# Patient Record
Sex: Female | Born: 1990
Health system: Southern US, Community
[De-identification: ages and names within clinical notes are randomized; demographics above are authoritative.]

## PROBLEM LIST (undated history)

## (undated) ENCOUNTER — Inpatient Hospital Stay (HOSPITAL_COMMUNITY): Payer: Self-pay

## (undated) DIAGNOSIS — Z789 Other specified health status: Secondary | ICD-10-CM

## (undated) DIAGNOSIS — O139 Gestational [pregnancy-induced] hypertension without significant proteinuria, unspecified trimester: Secondary | ICD-10-CM

## (undated) DIAGNOSIS — N390 Urinary tract infection, site not specified: Secondary | ICD-10-CM

## (undated) DIAGNOSIS — R519 Headache, unspecified: Secondary | ICD-10-CM

## (undated) DIAGNOSIS — K8020 Calculus of gallbladder without cholecystitis without obstruction: Secondary | ICD-10-CM

## (undated) HISTORY — DX: Other specified health status: Z78.9

## (undated) HISTORY — PX: APPENDECTOMY: SHX54

---

## 2018-12-12 NOTE — L&D Delivery Note (Signed)
Delivery Note  Labor onset: 09/17/2019  Labor Onset Time:[id number] Complete dilation at 3:00 PM  Onset of pushing at 1502 FHR second stage Cat 1 Analgesia/Anesthesia intrapartum: Epidural  Pt has female circumcision involving excision and fusion of the labia minora. Head was not delivered w/ strong maternal pushing efforts due to narrowed introitus. Defibulation of labia minora required for delivery. Pt informed and gives verbal consent. Site injected w/1% Lidocaine and cut. Delivery of a viable female w/ the force of 2 ctx @ 1708 by Clois Dupes, CNM and attended by Dr. Charlesetta Garibaldi. Fetal head delivered in LOA position and restituted to LOT.  No nuchal cord. Infant placed on maternal abd, dried, and tactile stim.   Cord double clamped after cessation of pulsation and cut by father.  Cord blood sample collected   Placenta delivered Schultz, intact, with 3 VC.  Placenta to L&D. Uterine tone firm w/ minimal bleeding   2nd degree lac identified. Defibrilated labia minor hemostatic Anesthesia: Epidural, 1% Lidocaine Repair 3-0 Vicryl, 4-0 Vicryl QBL (mL): 505 Complications: none APGAR: APGAR (1 MIN): 9   APGAR (5 MINS): 9   Weight: 2835 g/ 6# 4oz  Mom to Postpartum care on [facility].  Baby to Couplet care / Skin to Skin.  Arrie Eastern MSN, CNM 09/17/2019, 6:27 PM

## 2019-02-26 LAB — OB RESULTS CONSOLE ABO/RH: RH Type: POSITIVE

## 2019-02-26 LAB — OB RESULTS CONSOLE GC/CHLAMYDIA
Chlamydia: NEGATIVE
Gonorrhea: NEGATIVE

## 2019-02-26 LAB — OB RESULTS CONSOLE RUBELLA ANTIBODY, IGM: Rubella: IMMUNE

## 2019-02-26 LAB — OB RESULTS CONSOLE HEPATITIS B SURFACE ANTIGEN: Hepatitis B Surface Ag: NEGATIVE

## 2019-02-26 LAB — OB RESULTS CONSOLE ANTIBODY SCREEN: Antibody Screen: NEGATIVE

## 2019-02-26 LAB — OB RESULTS CONSOLE HIV ANTIBODY (ROUTINE TESTING): HIV: NONREACTIVE

## 2019-02-26 LAB — OB RESULTS CONSOLE RPR: RPR: NONREACTIVE

## 2019-07-19 ENCOUNTER — Other Ambulatory Visit: Payer: Self-pay

## 2019-07-19 ENCOUNTER — Encounter: Payer: Self-pay | Admitting: Student

## 2019-07-19 ENCOUNTER — Inpatient Hospital Stay (HOSPITAL_COMMUNITY)
Admission: AD | Admit: 2019-07-19 | Discharge: 2019-07-19 | Disposition: A | Discharge status: Home / Self Care | Payer: 59 | Attending: Obstetrics and Gynecology | Admitting: Obstetrics and Gynecology

## 2019-07-19 DIAGNOSIS — Z3A30 30 weeks gestation of pregnancy: Secondary | ICD-10-CM | POA: Insufficient documentation

## 2019-07-19 DIAGNOSIS — O479 False labor, unspecified: Secondary | ICD-10-CM | POA: Diagnosis not present

## 2019-07-19 DIAGNOSIS — O4703 False labor before 37 completed weeks of gestation, third trimester: Secondary | ICD-10-CM | POA: Diagnosis not present

## 2019-07-19 DIAGNOSIS — R1030 Lower abdominal pain, unspecified: Secondary | ICD-10-CM | POA: Diagnosis present

## 2019-07-19 HISTORY — DX: Other specified health status: Z78.9

## 2019-07-19 LAB — WET PREP, GENITAL
Sperm: NONE SEEN
Trich, Wet Prep: NONE SEEN
Yeast Wet Prep HPF POC: NONE SEEN

## 2019-07-19 LAB — URINALYSIS, ROUTINE W REFLEX MICROSCOPIC
Bilirubin Urine: NEGATIVE
Glucose, UA: NEGATIVE mg/dL
Hgb urine dipstick: NEGATIVE
Ketones, ur: NEGATIVE mg/dL
Leukocytes,Ua: NEGATIVE
Nitrite: NEGATIVE
Protein, ur: NEGATIVE mg/dL
Specific Gravity, Urine: 1.003 — ABNORMAL LOW (ref 1.005–1.030)
pH: 7 (ref 5.0–8.0)

## 2019-07-19 NOTE — MAU Note (Addendum)
Sharp pain in lower abd that radiates to lower back since this am. Denies LOF or bleeding. Reports normal BM and no dysuria. Good FM. Has felt the pain total of 4 times today

## 2019-07-19 NOTE — Progress Notes (Signed)
Specimens obtained without speculum by CNM

## 2019-07-19 NOTE — MAU Provider Note (Signed)
Chief Complaint:  Abdominal Pain and Back Pain   First Provider Initiated Contact with Patient 07/19/19 2022      HPI: Angela Russo is a 28 y.o. G2P0010 at [redacted]w[redacted]d by LMP who presents to maternity admissions reporting sharp intermittent lower abdominal pain today. She reports occasional pain similar to this x 2 weeks but only once per day or less often. Today, she has felt this pain 4 times since this morning, approximately 3 hours apart.  She describes the pain as sharp pain in the middle of her low abdomen that radiates to her low back.  She is drinking water and resting but has not tried other treatments.  There are no other associated symptoms.  She is feeling normal fetal movement.  She speaks Arabic and interpreter was used for initial interview, but pt later declined interpreter and was able to answer questions and discuss medical history in [place] without interpreter. Pt aware that Arabic language interpreter is available at any time.   HPI  Past Medical History: Past Medical History:  Diagnosis Date  . Medical history non-contributory     Past obstetric history: OB History  Gravida Para Term Preterm AB Living  2       1 0  SAB TAB Ectopic Multiple Live Births               # Outcome Date GA Lbr Len/2nd Weight Sex Delivery Anes PTL Lv  2 Current           1 AB             Past Surgical History: Past Surgical History:  Procedure Laterality Date  . APPENDECTOMY      Family History: History reviewed. No pertinent family history.  Social History: Social History   Tobacco Use  . Smoking status: Not on file  Substance Use Topics  . Alcohol use: Never    Frequency: Never  . Drug use: Never    Allergies: No Known Allergies  Meds:  Medications Prior to Admission  Medication Sig Dispense Refill Last Dose  . acetaminophen (TYLENOL) 500 MG tablet Take 1,000 mg by mouth every 6 (six) hours as needed.   07/19/2019 at 1100  . Prenatal Vit-Fe Fumarate-FA (PRENATAL  MULTIVITAMIN) TABS tablet Take 1 tablet by mouth daily at 12 noon.   07/19/2019 at Unknown time    ROS:  Review of Systems  Constitutional: Negative for chills, fatigue and fever.  Eyes: Negative for visual disturbance.  Respiratory: Negative for shortness of breath.   Cardiovascular: Negative for chest pain.  Gastrointestinal: Positive for abdominal pain. Negative for nausea and vomiting.  Genitourinary: Negative for difficulty urinating, dysuria, flank pain, pelvic pain, vaginal bleeding, vaginal discharge and vaginal pain.  Musculoskeletal: Positive for back pain.  Neurological: Negative for dizziness and headaches.  Psychiatric/Behavioral: Negative.      I have reviewed patient's Past Medical Hx, Surgical Hx, Family Hx, Social Hx, medications and allergies.   Physical Exam   Patient Vitals for the past 24 hrs:  BP Temp Pulse Resp Height Weight  07/19/19 2050 111/66 - 89 - - -  07/19/19 1941 116/72 98.4 F (36.9 C) (!) 112 16 5\' 2"  (1.575 m) 47.2 kg   Constitutional: Well-developed, well-nourished female in no acute distress.  Cardiovascular: normal rate Respiratory: normal effort GI: Abd soft, non-tender, gravid appropriate for gestational age.  MS: Extremities nontender, no edema, normal ROM Neurologic: Alert and oriented x 4.  GU: Neg CVAT.   Dilation: Closed Effacement (%):  Thick Exam by:: Sharen CounterLisa Leftwich-Kirby CNM  FHT:  Baseline 150, moderate variability, accelerations present, no decelerations Contractions: None on toco, irritability noted   Labs: Results for orders placed or performed during the hospital encounter of 07/19/19 (from the past 24 hour(s))  Urinalysis, Routine w reflex microscopic     Status: Abnormal   Collection Time: 07/19/19  8:01 PM  Result Value Ref Range   Color, Urine YELLOW YELLOW   APPearance CLOUDY (A) CLEAR   Specific Gravity, Urine 1.003 (L) 1.005 - 1.030   pH 7.0 5.0 - 8.0   Glucose, UA NEGATIVE NEGATIVE mg/dL   Hgb urine dipstick  NEGATIVE NEGATIVE   Bilirubin Urine NEGATIVE NEGATIVE   Ketones, ur NEGATIVE NEGATIVE mg/dL   Protein, ur NEGATIVE NEGATIVE mg/dL   Nitrite NEGATIVE NEGATIVE   Leukocytes,Ua NEGATIVE NEGATIVE      Imaging:  No results found.  MAU Course/MDM: Orders Placed This Encounter  Procedures  . Wet prep, genital  . Urinalysis, Routine w reflex microscopic  . Discharge patient    No orders of the defined types were placed in this encounter.    NST reviewed and reactive UA is normal without evidence of infection. Vaginal swabs collected with blind swab and pending but no evidence of infection.   Pain may be contractions or cramping but is infrequent, and with closed cervix, no evidence of preterm labor Recommend rest/ice/heat/warm bath/Tylenol for pain and increase PO fluids Preterm labor s/sx reviewed, pt to follow up with [facility], return to MAU for emergencies Pt discharge with strict return precautions.    Assessment: 1. Braxton Hicks contractions     Plan: Discharge home Labor precautions and fetal kick counts Follow-up Information    [facility]   Specialty: Obstetrics and Gynecology Why: As scheduled, return to MAU as needed for emergencies Contact information: [address] [place] [phone]         Allergies as of 07/19/2019   No Known Allergies     Medication List    TAKE these medications   acetaminophen 500 MG tablet Commonly known as: TYLENOL Take 1,000 mg by mouth every 6 (six) hours as needed.   prenatal multivitamin Tabs tablet Take 1 tablet by mouth daily at 12 noon.       Sharen CounterLisa Leftwich-Kirby Certified Nurse-Midwife 07/19/2019 9:20 PM

## 2019-07-19 NOTE — Progress Notes (Signed)
Written and verbal d/c instructions given and understanding vocied 

## 2019-07-23 LAB — GC/CHLAMYDIA PROBE AMP (~~LOC~~) NOT AT ARMC
Chlamydia: NEGATIVE
Neisseria Gonorrhea: NEGATIVE

## 2019-08-26 LAB — OB RESULTS CONSOLE GBS: GBS: NEGATIVE

## 2019-08-30 ENCOUNTER — Inpatient Hospital Stay (HOSPITAL_COMMUNITY)
Admission: AD | Admit: 2019-08-30 | Discharge: 2019-08-30 | Disposition: A | Discharge status: Home / Self Care | Payer: 59 | Attending: Obstetrics and Gynecology | Admitting: Obstetrics and Gynecology

## 2019-08-30 ENCOUNTER — Other Ambulatory Visit: Payer: Self-pay

## 2019-08-30 ENCOUNTER — Encounter (HOSPITAL_COMMUNITY): Payer: Self-pay

## 2019-08-30 DIAGNOSIS — O479 False labor, unspecified: Secondary | ICD-10-CM | POA: Diagnosis not present

## 2019-08-30 DIAGNOSIS — Z3A37 37 weeks gestation of pregnancy: Secondary | ICD-10-CM | POA: Diagnosis not present

## 2019-08-30 NOTE — MAU Note (Signed)
Pt c/o of contractions which started this morning. Says they are once an hour. Denies LOF, VB. +FM

## 2019-08-30 NOTE — MAU Note (Signed)
I have communicated with  Colman Cater, CNM  and reviewed vital signs:  Vitals:   08/30/19 1546 08/30/19 1651  BP: 106/78 115/88  Pulse: 78   Resp: 18   Temp: 97.7 F (36.5 C)   SpO2: 100%     Vaginal exam:  Dilation: Closed Effacement (%): Thick Cervical Position: Posterior Station: -1 Presentation: Vertex Exam by:: Colman Cater CNM,   Also reviewed contraction pattern and that non-stress test is reactive.  It has been documented that patient is having irregular contractions  and cervix is closed,  not indicating active labor.  Patient denies any other complaints.  Based on this report provider has given order for discharge.  A discharge order and diagnosis entered by a provider.   Labor discharge instructions reviewed with patient.

## 2019-08-30 NOTE — Discharge Instructions (Signed)
Braxton Hicks Contractions Contractions of the uterus can occur throughout pregnancy, but they are not always a sign that you are in labor. You may have practice contractions called Braxton Hicks contractions. These false labor contractions are sometimes confused with true labor. What are Braxton Hicks contractions? Braxton Hicks contractions are tightening movements that occur in the muscles of the uterus before labor. Unlike true labor contractions, these contractions do not result in opening (dilation) and thinning of the cervix. Toward the end of pregnancy (32-34 weeks), Braxton Hicks contractions can happen more often and may become stronger. These contractions are sometimes difficult to tell apart from true labor because they can be very uncomfortable. You should not feel embarrassed if you go to the hospital with false labor. Sometimes, the only way to tell if you are in true labor is for your health care provider to look for changes in the cervix. The health care provider will do a physical exam and may monitor your contractions. If you are not in true labor, the exam should show that your cervix is not dilating and your water has not broken. If there are no other health problems associated with your pregnancy, it is completely safe for you to be sent home with false labor. You may continue to have Braxton Hicks contractions until you go into true labor. How to tell the difference between true labor and false labor True labor  Contractions last 30-70 seconds.  Contractions become very regular.  Discomfort is usually felt in the top of the uterus, and it spreads to the lower abdomen and low back.  Contractions do not go away with walking.  Contractions usually become more intense and increase in frequency.  The cervix dilates and gets thinner. False labor  Contractions are usually shorter and not as strong as true labor contractions.  Contractions are usually irregular.  Contractions  are often felt in the front of the lower abdomen and in the groin.  Contractions may go away when you walk around or change positions while lying down.  Contractions get weaker and are shorter-lasting as time goes on.  The cervix usually does not dilate or become thin. Follow these instructions at home:   Take over-the-counter and prescription medicines only as told by your health care provider.  Keep up with your usual exercises and follow other instructions from your health care provider.  Eat and drink lightly if you think you are going into labor.  If Braxton Hicks contractions are making you uncomfortable: ? Change your position from lying down or resting to walking, or change from walking to resting. ? Sit and rest in a tub of warm water. ? Drink enough fluid to keep your urine pale yellow. Dehydration may cause these contractions. ? Do slow and deep breathing several times an hour.  Keep all follow-up prenatal visits as told by your health care provider. This is important. Contact a health care provider if:  You have a fever.  You have continuous pain in your abdomen. Get help right away if:  Your contractions become stronger, more regular, and closer together.  You have fluid leaking or gushing from your vagina.  You pass blood-tinged mucus (bloody show).  You have bleeding from your vagina.  You have low back pain that you never had before.  You feel your baby's head pushing down and causing pelvic pressure.  Your baby is not moving inside you as much as it used to. Summary  Contractions that occur before labor are   called Braxton Hicks contractions, false labor, or practice contractions.  Braxton Hicks contractions are usually shorter, weaker, farther apart, and less regular than true labor contractions. True labor contractions usually become progressively stronger and regular, and they become more frequent.  Manage discomfort from Braxton Hicks contractions  by changing position, resting in a warm bath, drinking plenty of water, or practicing deep breathing. This information is not intended to replace advice given to you by your health care provider. Make sure you discuss any questions you have with your health care provider. Document Released: 04/13/2017 Document Revised: 11/10/2017 Document Reviewed: 04/13/2017 Elsevier Patient Education  2020 Elsevier Inc.  

## 2019-09-15 ENCOUNTER — Inpatient Hospital Stay (EMERGENCY_DEPARTMENT_HOSPITAL)
Admission: AD | Admit: 2019-09-15 | Discharge: 2019-09-15 | Disposition: A | Discharge status: Home / Self Care | Payer: 59 | Source: Home / Self Care | Attending: Obstetrics and Gynecology | Admitting: Obstetrics and Gynecology

## 2019-09-15 ENCOUNTER — Other Ambulatory Visit: Payer: Self-pay

## 2019-09-15 ENCOUNTER — Encounter (HOSPITAL_COMMUNITY): Payer: Self-pay

## 2019-09-15 DIAGNOSIS — R030 Elevated blood-pressure reading, without diagnosis of hypertension: Secondary | ICD-10-CM

## 2019-09-15 DIAGNOSIS — N90810 Female genital mutilation status, unspecified: Secondary | ICD-10-CM | POA: Diagnosis present

## 2019-09-15 DIAGNOSIS — Z3A38 38 weeks gestation of pregnancy: Secondary | ICD-10-CM | POA: Insufficient documentation

## 2019-09-15 DIAGNOSIS — R519 Headache, unspecified: Secondary | ICD-10-CM | POA: Insufficient documentation

## 2019-09-15 DIAGNOSIS — O26893 Other specified pregnancy related conditions, third trimester: Secondary | ICD-10-CM | POA: Insufficient documentation

## 2019-09-15 DIAGNOSIS — Z3689 Encounter for other specified antenatal screening: Secondary | ICD-10-CM

## 2019-09-15 LAB — CBC
HCT: 34.9 % — ABNORMAL LOW (ref 36.0–46.0)
Hemoglobin: 11.5 g/dL — ABNORMAL LOW (ref 12.0–15.0)
MCH: 28.8 pg (ref 26.0–34.0)
MCHC: 33 g/dL (ref 30.0–36.0)
MCV: 87.5 fL (ref 80.0–100.0)
Platelets: 296 10*3/uL (ref 150–400)
RBC: 3.99 MIL/uL (ref 3.87–5.11)
RDW: 12.8 % (ref 11.5–15.5)
WBC: 6.4 10*3/uL (ref 4.0–10.5)
nRBC: 0 % (ref 0.0–0.2)

## 2019-09-15 LAB — COMPREHENSIVE METABOLIC PANEL
ALT: 25 U/L (ref 0–44)
AST: 30 U/L (ref 15–41)
Albumin: 2.8 g/dL — ABNORMAL LOW (ref 3.5–5.0)
Alkaline Phosphatase: 544 U/L — ABNORMAL HIGH (ref 38–126)
Anion gap: 11 (ref 5–15)
BUN: <5 mg/dL — ABNORMAL LOW (ref 6–20)
CO2: 20 mmol/L — ABNORMAL LOW (ref 22–32)
Calcium: 8.3 mg/dL — ABNORMAL LOW (ref 8.9–10.3)
Chloride: 106 mmol/L (ref 98–111)
Creatinine, Ser: 0.61 mg/dL (ref 0.44–1.00)
GFR calc Af Amer: >60 mL/min (ref >60)
GFR calc non Af Amer: >60 mL/min (ref >60)
Glucose, Bld: 70 mg/dL (ref 70–99)
Potassium: 3.4 mmol/L — ABNORMAL LOW (ref 3.5–5.1)
Sodium: 137 mmol/L (ref 135–145)
Total Bilirubin: 0.5 mg/dL (ref 0.3–1.2)
Total Protein: 6.2 g/dL — ABNORMAL LOW (ref 6.5–8.1)

## 2019-09-15 LAB — URINALYSIS, ROUTINE W REFLEX MICROSCOPIC
Bilirubin Urine: NEGATIVE
Glucose, UA: NEGATIVE mg/dL
Ketones, ur: 5 mg/dL — AB
Nitrite: NEGATIVE
Protein, ur: NEGATIVE mg/dL
Specific Gravity, Urine: 1.002 — ABNORMAL LOW (ref 1.005–1.030)
pH: 6 (ref 5.0–8.0)

## 2019-09-15 LAB — PROTEIN / CREATININE RATIO, URINE
Creatinine, Urine: 22.99 mg/dL
Total Protein, Urine: <6 mg/dL

## 2019-09-15 MED ORDER — TRAMADOL HCL 50 MG PO TABS
100.0000 mg | ORAL_TABLET | Freq: Once | ORAL | Status: AC
  Administered 2019-09-15: 100 mg via ORAL
  Filled 2019-09-15: qty 2

## 2019-09-15 NOTE — MAU Note (Signed)
Pt here for brown/bloody mucous that started today about 2 hrs ago. She reports irregular contractions. She denies LOF. She reports good fetal movement. Cervix was closed on last exam.

## 2019-09-15 NOTE — Discharge Instructions (Signed)

## 2019-09-15 NOTE — MAU Provider Note (Addendum)
Chief Complaint  Patient presents with  . Vaginal Discharge     First Provider Initiated Contact with Patient 09/15/19 1953      S: Angela Russo  is a 28 y.o. y.o. year old G56P0010 female at [redacted]w[redacted]d weeks gestation who presents to MAU with small amount of brown vaginal bleeding, irreg contractions and HA that didn't go away w/ 1 gm Tylenol at 1700. Elevated blood pressures in MAU. No Hx HTN. Started having similar HA's at 35 weeks. Current blood pressure medication: None.   Associated symptoms: Severe left temporal headache since this afternoon that decreased to moderate, denies vision changes. Had epigastric pain last night that resolved spontaneously.  Contractions: Irreg Vaginal bleeding: Scant brown blood Fetal movement: Nml  O:  Patient Vitals for the past 24 hrs:  BP Temp Temp src Pulse Resp SpO2 Height Weight  09/15/19 2030 119/83 - - 84 - - - -  09/15/19 2015 (!) 135/94 - - 95 - - - -  09/15/19 2011 (!) 145/96 - - 96 - - - -  09/15/19 1946 (!) 133/91 - - 99 - - - -  09/15/19 1924 127/86 97.8 F (36.6 C) Oral (!) 102 16 100 % 5\' 2"  (1.575 m) 50.2 kg   General: NAD Heart: Regular rate Lungs: Normal rate and effort Abd: Soft, NT, Gravid, S=D Extremities: No Pedal edema Neuro: 2+ deep tendon reflexes, No clonus Pelvic: NEFG, scant tan bleeding. No LOF.   Dilation: Closed Cervical Position: Posterior Exam by:: Manya Silvas, CNM  EFM: 140, Moderate variability, 15 x 15 accelerations, no decelerations Toco: UI  Results for orders placed or performed during the hospital encounter of 09/15/19 (from the past 24 hour(s))  CBC     Status: Abnormal   Collection Time: 09/15/19  8:23 PM  Result Value Ref Range   WBC 6.4 4.0 - 10.5 K/uL   RBC 3.99 3.87 - 5.11 MIL/uL   Hemoglobin 11.5 (L) 12.0 - 15.0 g/dL   HCT 34.9 (L) 36.0 - 46.0 %   MCV 87.5 80.0 - 100.0 fL   MCH 28.8 26.0 - 34.0 pg   MCHC 33.0 30.0 - 36.0 g/dL   RDW 12.8 11.5 - 15.5 %   Platelets 296 150 - 400 K/uL   nRBC 0.0 0.0 - 0.2 %  Comprehensive metabolic panel     Status: Abnormal   Collection Time: 09/15/19  8:23 PM  Result Value Ref Range   Sodium 137 135 - 145 mmol/L   Potassium 3.4 (L) 3.5 - 5.1 mmol/L   Chloride 106 98 - 111 mmol/L   CO2 20 (L) 22 - 32 mmol/L   Glucose, Bld 70 70 - 99 mg/dL   BUN <5 (L) 6 - 20 mg/dL   Creatinine, Ser 0.61 0.44 - 1.00 mg/dL   Calcium 8.3 (L) 8.9 - 10.3 mg/dL   Total Protein 6.2 (L) 6.5 - 8.1 g/dL   Albumin 2.8 (L) 3.5 - 5.0 g/dL   AST 30 15 - 41 U/L   ALT 25 0 - 44 U/L   Alkaline Phosphatase 544 (H) 38 - 126 U/L   Total Bilirubin 0.5 0.3 - 1.2 mg/dL   GFR calc non Af Amer >60 >60 mL/min   GFR calc Af Amer >60 >60 mL/min   Anion gap 11 5 - 15  Protein / creatinine ratio, urine     Status: None   Collection Time: 09/15/19  8:59 PM  Result Value Ref Range   Creatinine, Urine 22.99 mg/dL  Total Protein, Urine <6 mg/dL   Protein Creatinine Ratio        0.00 - 0.15 mg/mg[Cre]  Urinalysis, Routine w reflex microscopic     Status: Abnormal   Collection Time: 09/15/19  8:59 PM  Result Value Ref Range   Color, Urine YELLOW YELLOW   APPearance HAZY (A) CLEAR   Specific Gravity, Urine 1.002 (L) 1.005 - 1.030   pH 6.0 5.0 - 8.0   Glucose, UA NEGATIVE NEGATIVE mg/dL   Hgb urine dipstick SMALL (A) NEGATIVE   Bilirubin Urine NEGATIVE NEGATIVE   Ketones, ur 5 (A) NEGATIVE mg/dL   Protein, ur NEGATIVE NEGATIVE mg/dL   Nitrite NEGATIVE NEGATIVE   Leukocytes,Ua TRACE (A) NEGATIVE   RBC / HPF 0-5 0 - 5 RBC/hpf   WBC, UA 11-20 0 - 5 WBC/hpf   Bacteria, UA MANY (A) NONE SEEN   Squamous Epithelial / LPF 11-20 0 - 5   MAU Course: Orders Placed This Encounter  Procedures  . CBC  . Comprehensive metabolic panel  . Protein / creatinine ratio, urine  . Urinalysis, Routine w reflex microscopic  . Vital signs   Meds ordered this encounter  Medications  . traMADol (ULTRAM) tablet 100 mg   Care of pt turned over to Thalia Bloodgood, CNM at 2100.   Katrinka Blazing,  [place], CNM 09/15/2019 9:00 PM   A/P: --28 y.o. G2P0010 at [redacted]w[redacted]d  --Reactive tracing, closed cervix --New onset elevated BP, no severe range, normal PEC labs --Headache resolved with Ultram ordered by Ivonne Andrew, CNM --Urine culture pending --Discharge home in stable condition  F/U:  --Patient advised to have blood pressure check tomorrow in clinic. Voicemail left with Philemon Kingdom, CNM to assist with coordinating --Next scheduled OB appt 09/20/19  Clayton Bibles, MSN, CNM Certified Nurse Midwife, [facility] for [employer], [facility] 09/15/19 10:27 PM

## 2019-09-16 ENCOUNTER — Encounter (HOSPITAL_COMMUNITY): Payer: Self-pay | Admitting: *Deleted

## 2019-09-16 ENCOUNTER — Inpatient Hospital Stay (HOSPITAL_COMMUNITY): Payer: 59 | Admitting: Anesthesiology

## 2019-09-16 ENCOUNTER — Other Ambulatory Visit: Payer: Self-pay

## 2019-09-16 ENCOUNTER — Inpatient Hospital Stay (HOSPITAL_COMMUNITY)
Admission: AD | Admit: 2019-09-16 | Discharge: 2019-09-19 | DRG: 807 | Disposition: A | Discharge status: Home / Self Care | Payer: 59 | Attending: Obstetrics and Gynecology | Admitting: Obstetrics and Gynecology

## 2019-09-16 DIAGNOSIS — Z20828 Contact with and (suspected) exposure to other viral communicable diseases: Secondary | ICD-10-CM | POA: Diagnosis present

## 2019-09-16 DIAGNOSIS — O3483 Maternal care for other abnormalities of pelvic organs, third trimester: Secondary | ICD-10-CM | POA: Diagnosis present

## 2019-09-16 DIAGNOSIS — O1414 Severe pre-eclampsia complicating childbirth: Secondary | ICD-10-CM | POA: Diagnosis present

## 2019-09-16 DIAGNOSIS — O139 Gestational [pregnancy-induced] hypertension without significant proteinuria, unspecified trimester: Secondary | ICD-10-CM | POA: Diagnosis present

## 2019-09-16 DIAGNOSIS — N90810 Female genital mutilation status, unspecified: Secondary | ICD-10-CM | POA: Diagnosis present

## 2019-09-16 DIAGNOSIS — O26893 Other specified pregnancy related conditions, third trimester: Secondary | ICD-10-CM | POA: Diagnosis not present

## 2019-09-16 DIAGNOSIS — O133 Gestational [pregnancy-induced] hypertension without significant proteinuria, third trimester: Secondary | ICD-10-CM | POA: Diagnosis present

## 2019-09-16 DIAGNOSIS — Z3A38 38 weeks gestation of pregnancy: Secondary | ICD-10-CM | POA: Diagnosis not present

## 2019-09-16 DIAGNOSIS — R030 Elevated blood-pressure reading, without diagnosis of hypertension: Secondary | ICD-10-CM | POA: Diagnosis not present

## 2019-09-16 DIAGNOSIS — Z3A39 39 weeks gestation of pregnancy: Secondary | ICD-10-CM | POA: Diagnosis not present

## 2019-09-16 DIAGNOSIS — O134 Gestational [pregnancy-induced] hypertension without significant proteinuria, complicating childbirth: Secondary | ICD-10-CM | POA: Diagnosis present

## 2019-09-16 DIAGNOSIS — Z3490 Encounter for supervision of normal pregnancy, unspecified, unspecified trimester: Secondary | ICD-10-CM

## 2019-09-16 DIAGNOSIS — O1490 Unspecified pre-eclampsia, unspecified trimester: Secondary | ICD-10-CM | POA: Diagnosis not present

## 2019-09-16 LAB — COMPREHENSIVE METABOLIC PANEL
ALT: 31 U/L (ref 0–44)
AST: 47 U/L — ABNORMAL HIGH (ref 15–41)
Albumin: 3.1 g/dL — ABNORMAL LOW (ref 3.5–5.0)
Alkaline Phosphatase: 652 U/L — ABNORMAL HIGH (ref 38–126)
Anion gap: 16 — ABNORMAL HIGH (ref 5–15)
BUN: 5 mg/dL — ABNORMAL LOW (ref 6–20)
CO2: 19 mmol/L — ABNORMAL LOW (ref 22–32)
Calcium: 8.7 mg/dL — ABNORMAL LOW (ref 8.9–10.3)
Chloride: 101 mmol/L (ref 98–111)
Creatinine, Ser: 0.67 mg/dL (ref 0.44–1.00)
GFR calc Af Amer: >60 mL/min (ref >60)
GFR calc non Af Amer: >60 mL/min (ref >60)
Glucose, Bld: 53 mg/dL — ABNORMAL LOW (ref 70–99)
Potassium: 3.7 mmol/L (ref 3.5–5.1)
Sodium: 136 mmol/L (ref 135–145)
Total Bilirubin: 1.2 mg/dL (ref 0.3–1.2)
Total Protein: 6.9 g/dL (ref 6.5–8.1)

## 2019-09-16 LAB — TYPE AND SCREEN
ABO/RH(D): B POS
Antibody Screen: NEGATIVE

## 2019-09-16 LAB — URINALYSIS, ROUTINE W REFLEX MICROSCOPIC
Bilirubin Urine: NEGATIVE
Glucose, UA: NEGATIVE mg/dL
Hgb urine dipstick: NEGATIVE
Ketones, ur: 80 mg/dL — AB
Leukocytes,Ua: NEGATIVE
Nitrite: NEGATIVE
Protein, ur: NEGATIVE mg/dL
Specific Gravity, Urine: 1.01 (ref 1.005–1.030)
pH: 6 (ref 5.0–8.0)

## 2019-09-16 LAB — SARS CORONAVIRUS 2 BY RT PCR (HOSPITAL ORDER, PERFORMED IN ~~LOC~~ HOSPITAL LAB): SARS Coronavirus 2: NEGATIVE

## 2019-09-16 LAB — CBC
HCT: 36.4 % (ref 36.0–46.0)
Hemoglobin: 12.2 g/dL (ref 12.0–15.0)
MCH: 29.3 pg (ref 26.0–34.0)
MCHC: 33.5 g/dL (ref 30.0–36.0)
MCV: 87.3 fL (ref 80.0–100.0)
Platelets: 326 10*3/uL (ref 150–400)
RBC: 4.17 MIL/uL (ref 3.87–5.11)
RDW: 13 % (ref 11.5–15.5)
WBC: 7.8 10*3/uL (ref 4.0–10.5)
nRBC: 0 % (ref 0.0–0.2)

## 2019-09-16 LAB — ABO/RH: ABO/RH(D): B POS

## 2019-09-16 LAB — PROTEIN / CREATININE RATIO, URINE
Creatinine, Urine: 65.13 mg/dL
Protein Creatinine Ratio: 0.34 mg/mg{Cre} — ABNORMAL HIGH (ref 0.00–0.15)
Total Protein, Urine: 22 mg/dL

## 2019-09-16 MED ORDER — EPHEDRINE 5 MG/ML INJ: 10.0000 mg | INTRAVENOUS | Status: DC | PRN

## 2019-09-16 MED ORDER — ONDANSETRON HCL 4 MG/2ML IJ SOLN: 4.0000 mg | Freq: Four times a day (QID) | INTRAMUSCULAR | Status: DC

## 2019-09-16 MED ORDER — PHENYLEPHRINE 40 MCG/ML (10ML) SYRINGE FOR IV PUSH (FOR BLOOD PRESSURE SUPPORT): 80.0000 ug | PREFILLED_SYRINGE | INTRAVENOUS | Status: DC | PRN

## 2019-09-16 MED ORDER — OXYTOCIN 40 UNITS IN NORMAL SALINE INFUSION - SIMPLE MED
1.0000 m[IU]/min | INTRAVENOUS | Status: DC
  Administered 2019-09-16: 4 m[IU]/min via INTRAVENOUS
  Administered 2019-09-16: 2 m[IU]/min via INTRAVENOUS
  Administered 2019-09-17: 06:00:00 10 m[IU]/min via INTRAVENOUS
  Administered 2019-09-17: 6 m[IU]/min via INTRAVENOUS
  Administered 2019-09-17: 12 m[IU]/min via INTRAVENOUS
  Administered 2019-09-17: 05:00:00 8 m[IU]/min via INTRAVENOUS

## 2019-09-16 MED ORDER — DIPHENHYDRAMINE HCL 50 MG/ML IJ SOLN: 12.5000 mg | INTRAMUSCULAR | Status: DC | PRN

## 2019-09-16 MED ORDER — HYDRALAZINE HCL 20 MG/ML IJ SOLN: 10.0000 mg | INTRAMUSCULAR | Status: DC | PRN

## 2019-09-16 MED ORDER — ONDANSETRON HCL 4 MG/2ML IJ SOLN
4.0000 mg | Freq: Four times a day (QID) | INTRAMUSCULAR | Status: DC | PRN
  Administered 2019-09-16 – 2019-09-17 (×2): 4 mg via INTRAVENOUS
  Filled 2019-09-16 (×2): qty 2

## 2019-09-16 MED ORDER — LABETALOL HCL 5 MG/ML IV SOLN: 80.0000 mg | INTRAVENOUS | Status: DC | PRN

## 2019-09-16 MED ORDER — LACTATED RINGERS IV SOLN
INTRAVENOUS | Status: DC
  Administered 2019-09-16 (×2): via INTRAVENOUS

## 2019-09-16 MED ORDER — SOD CITRATE-CITRIC ACID 500-334 MG/5ML PO SOLN: 30.0000 mL | ORAL | Status: DC | PRN

## 2019-09-16 MED ORDER — OXYTOCIN 40 UNITS IN NORMAL SALINE INFUSION - SIMPLE MED
2.5000 [IU]/h | INTRAVENOUS | Status: DC
  Filled 2019-09-16: qty 1000

## 2019-09-16 MED ORDER — LACTATED RINGERS IV SOLN: 500.0000 mL | INTRAVENOUS | Status: DC | PRN

## 2019-09-16 MED ORDER — OXYTOCIN BOLUS FROM INFUSION
500.0000 mL | Freq: Once | INTRAVENOUS | Status: AC
  Administered 2019-09-17: 500 mL via INTRAVENOUS

## 2019-09-16 MED ORDER — OXYCODONE-ACETAMINOPHEN 5-325 MG PO TABS: 1.0000 | ORAL_TABLET | ORAL | Status: DC | PRN

## 2019-09-16 MED ORDER — OXYCODONE-ACETAMINOPHEN 5-325 MG PO TABS: 2.0000 | ORAL_TABLET | ORAL | Status: DC | PRN

## 2019-09-16 MED ORDER — LACTATED RINGERS IV SOLN: 500.0000 mL | Freq: Once | INTRAVENOUS | Status: DC

## 2019-09-16 MED ORDER — FENTANYL-BUPIVACAINE-NACL 0.5-0.125-0.9 MG/250ML-% EP SOLN
12.0000 mL/h | EPIDURAL | Status: DC | PRN
  Administered 2019-09-17: 12 mL/h via EPIDURAL
  Filled 2019-09-16 (×2): qty 250

## 2019-09-16 MED ORDER — MISOPROSTOL 25 MCG QUARTER TABLET
25.0000 ug | ORAL_TABLET | ORAL | Status: DC
  Administered 2019-09-16: 15:00:00 25 ug via VAGINAL
  Filled 2019-09-16: qty 1

## 2019-09-16 MED ORDER — ONDANSETRON HCL 4 MG/2ML IJ SOLN: 4.0000 mg | Freq: Four times a day (QID) | INTRAMUSCULAR | Status: DC | PRN

## 2019-09-16 MED ORDER — DEXTROSE 5 % IN LACTATED RINGERS IV BOLUS
500.0000 mL | Freq: Once | INTRAVENOUS | Status: AC
  Administered 2019-09-16: 16:00:00 500 mL via INTRAVENOUS

## 2019-09-16 MED ORDER — LIDOCAINE HCL (PF) 1 % IJ SOLN
30.0000 mL | INTRAMUSCULAR | Status: AC | PRN
  Administered 2019-09-17: 30 mL via SUBCUTANEOUS
  Filled 2019-09-16: qty 30

## 2019-09-16 MED ORDER — TERBUTALINE SULFATE 1 MG/ML IJ SOLN: 0.2500 mg | Freq: Once | INTRAMUSCULAR | Status: DC | PRN

## 2019-09-16 MED ORDER — LABETALOL HCL 5 MG/ML IV SOLN: 20.0000 mg | INTRAVENOUS | Status: DC | PRN

## 2019-09-16 MED ORDER — ACETAMINOPHEN 325 MG PO TABS: 650.0000 mg | ORAL_TABLET | ORAL | Status: DC | PRN

## 2019-09-16 MED ORDER — LIDOCAINE-EPINEPHRINE (PF) 2 %-1:200000 IJ SOLN
INTRAMUSCULAR | Status: DC | PRN
  Administered 2019-09-16: 2 mL via EPIDURAL
  Administered 2019-09-16: 1 mL via EPIDURAL

## 2019-09-16 MED ORDER — SODIUM CHLORIDE (PF) 0.9 % IJ SOLN
INTRAMUSCULAR | Status: DC | PRN
  Administered 2019-09-16: 10 mL/h via EPIDURAL

## 2019-09-16 MED ORDER — LABETALOL HCL 5 MG/ML IV SOLN: 40.0000 mg | INTRAVENOUS | Status: DC | PRN

## 2019-09-16 NOTE — Progress Notes (Signed)
Labor Progress Note  Admission Date: 09/16/2019 11:37 AM  Admit Diagnosis: Gestational HTN  Angela Russo is a 28 y.o. female presenting for HA and vomiting. Treated and released from MAU on 10/4 for HA. HA was not resolved w/ Tylenol 1G. HA resolved w/ Tramadol. BP elevated yesterday, highest BP 145/96.Pt was seen in the office, c/o return of HA and vomiting all night. Pt to MAU for evaluation. Denies hx of HA/migraines, normotensive. [place] labs collected on 10/4 and WNL.   Subjective: Pt in bed with husband supportive at bedside in NAD, comfortable with epidural placement. Pt having nausea. Pt denies HA, vision changes or RUQ pain.  Patient Active Problem List   Diagnosis Date Noted  . Gestational hypertension 09/16/2019  . Encounter for induction of labor 09/16/2019  . Gestational hypertension w/o significant proteinuria in 3rd trimester 09/16/2019  . 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  SVE:  Dilation: 2 Effacement (%): 90 Station: 0 Exam by:: [facility], CNM Pitocin at (to start now) mUn/min 2+Patellar DTR No clonus.  Lungs CTA  Assessment:  Angela Russo, 28 y.o., [id number], with an IUP @ [redacted]w[redacted]d, presented for IOL for preeclampsia with out severe features. Nauseas. Slowly progressed in early labor off 1 cytotec.  Patient Active Problem List   Diagnosis Date Noted  . Gestational hypertension 09/16/2019  . Encounter for induction of labor 09/16/2019  . Gestational hypertension w/o  significant proteinuria in 3rd trimester 09/16/2019  . 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  Frequent position changes to facilitate fetal rotation and descent. Will reassess with cervical exam if necessary Start pitocin per protocol 2x2 now Anticipate labor progression and vaginal delivery.   Md Ozan aware of plan and verbalized agreement.   Noralyn Pick, NP-C, CNM, MSN 09/16/2019. 9:24 PM

## 2019-09-16 NOTE — Progress Notes (Signed)
Subjective:   C/O increased pain w/ ctx and requesting epidural.   Objective:    VS: BP 114/73   Pulse 87   Temp 97.9 F (36.6 C) (Oral)   Resp 18   Ht 5\' 2"  (1.575 m)   Wt 49 kg   SpO2 100%   BMI 19.76 kg/m  FHR : baseline 135 / variability moderate / accelerations present / absent decelerations Toco: contractions every 2 minutes  Membranes: intact Dilation: 3 Effacement (%): 80 Cervical Position: mid-position Station: 0 Presentation: Vertex Exam by: Burman Foster, CNM   CMP     Component Value Date/Time   NA 136 09/16/2019 1421   K 3.7 09/16/2019 1421   CL 101 09/16/2019 1421   CO2 19 (L) 09/16/2019 1421   GLUCOSE 53 (L) 09/16/2019 1421   BUN 5 (L) 09/16/2019 1421   CREATININE 0.67 09/16/2019 1421   CALCIUM 8.7 (L) 09/16/2019 1421   PROT 6.9 09/16/2019 1421   ALBUMIN 3.1 (L) 09/16/2019 1421   AST 47 (H) 09/16/2019 1421   ALT 31 09/16/2019 1421   ALKPHOS 652 (H) 09/16/2019 1421   BILITOT 1.2 09/16/2019 1421   GFRNONAA >60 09/16/2019 1421   GFRAA >60 09/16/2019 1421    Assessment/Plan:   28 y.o. [id number] [redacted]w[redacted]d  Labor: IOL w/ Cytotec Preeclampsia:  w/o severe features Fetal Wellbeing:  Category I Pain Control:  Plans epidural  I/D:  GBS negative Anticipated MOD:  NSVD    Arrie Eastern CNM, MSN 09/16/2019 7:47 PM

## 2019-09-16 NOTE — MAU Note (Signed)
.   Angela Russo is a 28 y.o. at [redacted]w[redacted]d here in MAU reporting: increase in blood pressure and vomiting all night.PT was evaluated in MAU yesterday and sent home. Went to appointment and had an increase in blood pressure with headache  Onset of complaint: yesterday Pain score: 7 Vitals:   09/16/19 1208  BP: 131/86  Pulse: 85  Resp: 16  Temp: 98.3 F (36.8 C)  SpO2: 100%     FHT:160 Lab orders placed from triage: UA

## 2019-09-16 NOTE — Anesthesia Procedure Notes (Signed)
Epidural Patient location during procedure: OB  Staffing Anesthesiologist: Freddrick March, MD Performed: anesthesiologist   Preanesthetic Checklist Completed: patient identified, site marked, surgical consent, pre-op evaluation, timeout performed, IV checked, risks and benefits discussed and monitors and equipment checked  Epidural Patient position: sitting Prep: DuraPrep Patient monitoring: heart rate, continuous pulse ox and blood pressure Approach: midline Location: L3-L4 Injection technique: LOR saline  Needle:  Needle type: Tuohy  Needle gauge: 17 G Needle length: 9 cm and 9 Needle insertion depth: 6 cm Catheter type: closed end flexible Catheter size: 20 Guage Catheter at skin depth: 10 cm Test dose: negative  Assessment Events: blood not aspirated, injection not painful, no injection resistance, negative IV test and no paresthesia  Additional Notes Patient identified. Risks/Benefits/Options discussed with patient including but not limited to bleeding, infection, nerve damage, paralysis, failed block, incomplete pain control, headache, blood pressure changes, nausea, vomiting, reactions to medication both or allergic, itching and postpartum back pain. Confirmed with bedside nurse the patient's most recent platelet count. Confirmed with patient that they are not currently taking any anticoagulation, have any bleeding history or any family history of bleeding disorders. Patient expressed understanding and wished to proceed. All questions were answered. Sterile technique was used throughout the entire procedure. Please see nursing notes for vital signs. Test dose was given through epidural needle and negative prior to continuing to dose epidural or start infusion. Warning signs of high block given to the patient including shortness of breath, tingling/numbness in hands, complete motor block, or any concerning symptoms with instructions to call for help. Patient was given  instructions on fall risk and not to get out of bed. All questions and concerns addressed with instructions to call with any issues.

## 2019-09-16 NOTE — H&P (Addendum)
OB ADMISSION/ HISTORY & PHYSICAL:  Admission Date: 09/16/2019 11:37 AM  Admit Diagnosis: Gestational HTN  Angela Russo is a 28 y.o. female presenting for HA and vomiting. Treated and released from MAU on 10/4 for HA. HA was not resolved w/ Tylenol 1G. HA resolved w/ Tramadol. BP elevated yesterday, highest BP 145/96.Pt was seen in the office, c/o return of HA and vomiting all night. Pt to MAU for evaluation. Denies hx of HA/migraines, normotensive. Angela Russo labs collected on 10/4 and WNL. Plan discussed w/ Angela Russo. Discussed IOL for Gest HTN w/ pt and pt agrees. Endorses active FM, denies LOF and vaginal bleeding.   Prenatal History: G2P0010   EDC : 09/23/2019, by LMP and C/W 10 +2 wk U/S Prenatal care at [facility]  Prenatal course complicated by   Patient Active Problem List   Diagnosis Date Noted  . Gestational hypertension 09/16/2019  . Encounter for induction of labor 09/16/2019  . Gestational hypertension w/o significant proteinuria in 3rd trimester 09/16/2019  . 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 Medical history non-contributory     Past surgical history:  Past Surgical History:  Procedure Laterality Date  . APPENDECTOMY     Family History: History reviewed. No pertinent family history.  Social History:  reports that she has never smoked. She has never used smokeless tobacco. She reports that she does not drink alcohol or use drugs.  Allergies: Patient has no  known allergies.   Current Medications at time of admission:  Prior to Admission medications   Medication Sig Start Date End Date Taking? Authorizing Provider  acetaminophen (TYLENOL) 500 MG tablet Take 1,000 mg by mouth every 6 (six) hours as needed.   Yes [provider]  Prenatal Vit-Fe Fumarate-FA (PRENATAL MULTIVITAMIN) TABS tablet Take 1 tablet by mouth daily at 12 noon.   Yes [provider]    Review of Systems: Constitutional: Negative.   HENT: Negative.   Eyes: Negative.   Respiratory: Negative.   Cardiovascular: Negative.   Gastrointestinal: Positive for vomiting   Genitourinary: Negative Musculoskeletal: Negative.   Skin: Negative.   Neurological: Negative.   Endo/Heme/Allergies: Negative.   Psychiatric/Behavioral: Negative.    Physical Exam: VS: Blood pressure 132/88, pulse 97, temperature 98.3 F (36.8 C), resp. rate 16, weight 49 kg, SpO2 100 %.   AAO x3, no signs of distress Cardiovascular: RRR Respiratory: Lung fields clear with ausculation GU/GI: Abdomen gravid, non-tender, non-distended, active FM, vertex and 6.5# per Leopold's Extremities: No edema, negative for pain, tenderness, and cords  Cervical exam:Dilation: 1 Effacement (%): 80 Station: 0 Exam by:: Angela Russo, CNM FHR: baseline rate 135 / variability moderate / accelerations present / absent decelerations TOCO: irregular, mild per palpation  Prenatal Transfer Tool  Maternal  Diabetes: No Genetic Screening: Normal Maternal Ultrasounds/Referrals: Normal Fetal Ultrasounds or other Referrals:  None Maternal Substance Abuse:  No Significant Maternal Medications:  None Significant Maternal Lab Results: Group B Strep negative    Assessment: 28 y.o. G2P0010 [redacted]w[redacted]d IOL for GHTN w/ mild features FHR category 1 GBS negative Pain management: Plans epidural   Plan:  Admit to L&D for IOL     -Cytotec PV Routine admission orders     -Repeat PIH labs Epidural PRN  Plan  reviewed w/Dr. Su Russo.   Angela Russo CNM, MSN 09/16/2019 2:12 PM  BPs ok right now.  If HA persists or BPs elevated, will start MgSO4.  Will plan to start MgSO4 PP.  HA resolved once pt got to L&D and after clears.  AST increased and Urine PCR 0.34.  Tracing reviewed in house and cat 1.  Angela Russo, CNM report cervix 3cm after 1 dose of cytotec.  Will cont to observe for now and start pitocin and AROM as indicated.

## 2019-09-16 NOTE — Anesthesia Preprocedure Evaluation (Signed)

## 2019-09-17 ENCOUNTER — Encounter (HOSPITAL_COMMUNITY): Payer: Self-pay | Admitting: *Deleted

## 2019-09-17 LAB — CBC WITH DIFFERENTIAL/PLATELET
Abs Immature Granulocytes: 0.06 10*3/uL (ref 0.00–0.07)
Basophils Absolute: 0 10*3/uL (ref 0.0–0.1)
Basophils Relative: 0 %
Eosinophils Absolute: 0 10*3/uL (ref 0.0–0.5)
Eosinophils Relative: 0 %
HCT: 34.2 % — ABNORMAL LOW (ref 36.0–46.0)
Hemoglobin: 11.8 g/dL — ABNORMAL LOW (ref 12.0–15.0)
Immature Granulocytes: 0 %
Lymphocytes Relative: 7 %
Lymphs Abs: 1 10*3/uL (ref 0.7–4.0)
MCH: 29.4 pg (ref 26.0–34.0)
MCHC: 34.5 g/dL (ref 30.0–36.0)
MCV: 85.3 fL (ref 80.0–100.0)
Monocytes Absolute: 2.1 10*3/uL — ABNORMAL HIGH (ref 0.1–1.0)
Monocytes Relative: 15 %
Neutro Abs: 11.1 10*3/uL — ABNORMAL HIGH (ref 1.7–7.7)
Neutrophils Relative %: 78 %
Platelets: 279 10*3/uL (ref 150–400)
RBC: 4.01 MIL/uL (ref 3.87–5.11)
RDW: 12.9 % (ref 11.5–15.5)
WBC: 14.3 10*3/uL — ABNORMAL HIGH (ref 4.0–10.5)
nRBC: 0 % (ref 0.0–0.2)

## 2019-09-17 LAB — COMPREHENSIVE METABOLIC PANEL
ALT: 29 U/L (ref 0–44)
AST: 38 U/L (ref 15–41)
Albumin: 2.6 g/dL — ABNORMAL LOW (ref 3.5–5.0)
Alkaline Phosphatase: 562 U/L — ABNORMAL HIGH (ref 38–126)
Anion gap: 14 (ref 5–15)
BUN: <5 mg/dL — ABNORMAL LOW (ref 6–20)
CO2: 19 mmol/L — ABNORMAL LOW (ref 22–32)
Calcium: 8.4 mg/dL — ABNORMAL LOW (ref 8.9–10.3)
Chloride: 104 mmol/L (ref 98–111)
Creatinine, Ser: 0.75 mg/dL (ref 0.44–1.00)
GFR calc Af Amer: >60 mL/min (ref >60)
GFR calc non Af Amer: >60 mL/min (ref >60)
Glucose, Bld: 71 mg/dL (ref 70–99)
Potassium: 3.1 mmol/L — ABNORMAL LOW (ref 3.5–5.1)
Sodium: 137 mmol/L (ref 135–145)
Total Bilirubin: 1.9 mg/dL — ABNORMAL HIGH (ref 0.3–1.2)
Total Protein: 6 g/dL — ABNORMAL LOW (ref 6.5–8.1)

## 2019-09-17 LAB — HIV ANTIBODY (ROUTINE TESTING W REFLEX): HIV Screen 4th Generation wRfx: NONREACTIVE

## 2019-09-17 LAB — RPR: RPR Ser Ql: NONREACTIVE

## 2019-09-17 LAB — CBC
HCT: 35.6 % — ABNORMAL LOW (ref 36.0–46.0)
Hemoglobin: 11.7 g/dL — ABNORMAL LOW (ref 12.0–15.0)
MCH: 28.7 pg (ref 26.0–34.0)
MCHC: 32.9 g/dL (ref 30.0–36.0)
MCV: 87.5 fL (ref 80.0–100.0)
Platelets: 280 10*3/uL (ref 150–400)
RBC: 4.07 MIL/uL (ref 3.87–5.11)
RDW: 13.2 % (ref 11.5–15.5)
WBC: 23.5 10*3/uL — ABNORMAL HIGH (ref 4.0–10.5)
nRBC: 0 % (ref 0.0–0.2)

## 2019-09-17 LAB — URIC ACID: Uric Acid, Serum: 6.7 mg/dL (ref 2.5–7.1)

## 2019-09-17 LAB — LACTATE DEHYDROGENASE: LDH: 199 U/L — ABNORMAL HIGH (ref 98–192)

## 2019-09-17 MED ORDER — ONDANSETRON HCL 4 MG/2ML IJ SOLN: 4.0000 mg | INTRAMUSCULAR | Status: DC | PRN

## 2019-09-17 MED ORDER — ONDANSETRON HCL 4 MG PO TABS: 4.0000 mg | ORAL_TABLET | ORAL | Status: DC | PRN

## 2019-09-17 MED ORDER — OXYCODONE HCL 5 MG PO TABS: 5.0000 mg | ORAL_TABLET | ORAL | Status: DC | PRN

## 2019-09-17 MED ORDER — ACETAMINOPHEN 325 MG PO TABS
650.0000 mg | ORAL_TABLET | ORAL | Status: DC | PRN
  Administered 2019-09-18 – 2019-09-19 (×3): 650 mg via ORAL
  Filled 2019-09-17 (×3): qty 2

## 2019-09-17 MED ORDER — PRENATAL MULTIVITAMIN CH
1.0000 | ORAL_TABLET | Freq: Every day | ORAL | Status: DC
  Administered 2019-09-18: 1 via ORAL
  Filled 2019-09-17: qty 1

## 2019-09-17 MED ORDER — LABETALOL HCL 5 MG/ML IV SOLN: 80.0000 mg | INTRAVENOUS | Status: DC | PRN

## 2019-09-17 MED ORDER — MAGNESIUM SULFATE 40 GM/1000ML IV SOLN
2.0000 g/h | INTRAVENOUS | Status: DC
  Filled 2019-09-17: qty 1000

## 2019-09-17 MED ORDER — MAGNESIUM SULFATE 40 GM/1000ML IV SOLN
2.0000 g/h | INTRAVENOUS | Status: AC
  Administered 2019-09-18: 2 g/h via INTRAVENOUS
  Filled 2019-09-17: qty 1000

## 2019-09-17 MED ORDER — MAGNESIUM SULFATE BOLUS VIA INFUSION
4.0000 g | Freq: Once | INTRAVENOUS | Status: AC
  Administered 2019-09-17: 4 g via INTRAVENOUS
  Filled 2019-09-17: qty 1000

## 2019-09-17 MED ORDER — HYDRALAZINE HCL 20 MG/ML IJ SOLN: 10.0000 mg | INTRAMUSCULAR | Status: DC | PRN

## 2019-09-17 MED ORDER — TETANUS-DIPHTH-ACELL PERTUSSIS 5-2.5-18.5 LF-MCG/0.5 IM SUSP: 0.5000 mL | Freq: Once | INTRAMUSCULAR | Status: DC

## 2019-09-17 MED ORDER — MISOPROSTOL 200 MCG PO TABS
800.0000 ug | ORAL_TABLET | Freq: Once | ORAL | Status: DC
  Filled 2019-09-17: qty 4

## 2019-09-17 MED ORDER — ACETAMINOPHEN 500 MG PO TABS
1000.0000 mg | ORAL_TABLET | ORAL | Status: AC
  Administered 2019-09-17: 10:00:00 1000 mg via ORAL
  Filled 2019-09-17: qty 2

## 2019-09-17 MED ORDER — DIPHENHYDRAMINE HCL 25 MG PO CAPS: 25.0000 mg | ORAL_CAPSULE | Freq: Four times a day (QID) | ORAL | Status: DC | PRN

## 2019-09-17 MED ORDER — WITCH HAZEL-GLYCERIN EX PADS: 1.0000 "application " | MEDICATED_PAD | CUTANEOUS | Status: DC | PRN

## 2019-09-17 MED ORDER — COCONUT OIL OIL: 1.0000 "application " | TOPICAL_OIL | Status: DC | PRN

## 2019-09-17 MED ORDER — DIBUCAINE (PERIANAL) 1 % EX OINT: 1.0000 "application " | TOPICAL_OINTMENT | CUTANEOUS | Status: DC | PRN

## 2019-09-17 MED ORDER — BENZOCAINE-MENTHOL 20-0.5 % EX AERO
1.0000 "application " | INHALATION_SPRAY | CUTANEOUS | Status: DC | PRN
  Administered 2019-09-19: 1 via TOPICAL
  Filled 2019-09-17: qty 56

## 2019-09-17 MED ORDER — LABETALOL HCL 5 MG/ML IV SOLN: 20.0000 mg | INTRAVENOUS | Status: DC | PRN

## 2019-09-17 MED ORDER — IBUPROFEN 600 MG PO TABS: 600.0000 mg | ORAL_TABLET | Freq: Four times a day (QID) | ORAL | Status: DC

## 2019-09-17 MED ORDER — LABETALOL HCL 5 MG/ML IV SOLN: 40.0000 mg | INTRAVENOUS | Status: DC | PRN

## 2019-09-17 MED ORDER — SENNOSIDES-DOCUSATE SODIUM 8.6-50 MG PO TABS
2.0000 | ORAL_TABLET | ORAL | Status: DC
  Administered 2019-09-17 – 2019-09-18 (×2): 2 via ORAL
  Filled 2019-09-17 (×2): qty 2

## 2019-09-17 MED ORDER — SIMETHICONE 80 MG PO CHEW: 80.0000 mg | CHEWABLE_TABLET | ORAL | Status: DC | PRN

## 2019-09-17 NOTE — Lactation Note (Signed)
This note was copied from a baby's chart. Lactation Consultation Note  Patient Name: Angela Russo [id number] Date: 09/17/2019    2130 - Per evening staff RN, [name] has declined lactation assistance. She would prefer to work on breast feeding on her own. I encouraged RN to monitor for [name]' progress and to encourage her to ask for assistance as needed.   Lenore Manner 09/17/2019, 10:40 PM

## 2019-09-17 NOTE — Progress Notes (Signed)
Labor Progress Note  Admission Date: 09/16/2019 11:37 AM  Admit Diagnosis: Gestational HTN  Angela Russo is a 28 y.o. female presenting for HA and vomiting. Treated and released from MAU on 10/4 for HA. HA was not resolved w/ Tylenol 1G. HA resolved w/ Tramadol. BP elevated yesterday, highest BP 145/96.Pt was seen in the office, c/o return of HA and vomiting all night. Pt to MAU for evaluation. Denies hx of HA/migraines, normotensive. [place] labs collected on 10/4 and WNL.   Subjective: Pt in bed with husband supportive at bedside in NAD, comfortable with epidural placement. Pt denies HA, vision changes or RUQ pain. Discussed AROM and placement of IUPC, reviewed risk and benefits  Patient Active Problem List   Diagnosis Date Noted  . Gestational hypertension 09/16/2019  . Encounter for induction of labor 09/16/2019  . Gestational hypertension w/o significant proteinuria in 3rd trimester 09/16/2019  . 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  SVE:  Dilation: 4 Effacement (%): 90 Station: 0 Exam by:: Noralyn Pick, FNP CNM Pitocin at (4) mUn/min 2+Patellar DTR No clonus.  Lungs CTA  AROM, clear, small amount, tolerated well, CAt1 strip IUPC place, tolerated well, MVUs 120  Assessment:  Angela Russo, 28 y.o., [id number], with an IUP @ [redacted]w[redacted]d, presented for IOL for preeclampsia with out severe features. Slowly progressed in early labor off 1 cytotec and now pitocin. Has only made slow change in latent  labor now, cxt appeared strong on toco but palpated mild, AROM with IUPC placed to increase pitocin properly.  Patient Active Problem List   Diagnosis Date Noted  . Gestational hypertension 09/16/2019  . Encounter for induction of labor 09/16/2019  . Gestational hypertension w/o significant proteinuria in 3rd trimester 09/16/2019  . 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  Frequent position changes to facilitate fetal rotation and descent. Will reassess with cervical exam if necessary Continue pitocin per protocol 2x2 now increase to max of MVU 200 Anticipate labor progression and vaginal delivery.   Plan to report off to [facility] and Dr Charlesetta Garibaldi at 0700 when they will assume care of pt.   Noralyn Pick, NP-C, CNM, MSN 09/17/2019. 4:39 AM

## 2019-09-17 NOTE — Progress Notes (Signed)
Angela Russo is a 28 y.o. G3P0020 at [redacted]w[redacted]d admitted for induction of labor due to Gest HTN.  Subjective: Reports feeling pressure w/ ctx, mild HA, RUQ pain. Denies visual changes. Comfortable w/ epidural.    Objective: BP 130/85   Pulse 88   Temp 98 F (36.7 C) (Oral)   Resp 18   Ht 5\' 2"  (1.575 m)   Wt 49 kg   SpO2 100%   BMI 19.76 kg/m  I/O last 3 completed shifts: In: 1535.6 [I.V.:1035.6; IV Piggyback:500] Out: 1400 [Urine:1400] No intake/output data recorded.   Physical Exam:  Const: AAO x3, no signs of distress Cardiovascular: RRR Respiratory: Lung fields clear with ausculation GI: Significant for RUQ tenderness GU: clear urine, foley to gravity Neuro: +1 reflexes, neg clonus Extremities: No edema, negative for pain, tenderness, and cords  FHT:  FHR: 130 bpm, variability: moderate,  accelerations:  Present,  decelerations:  Present early UC:   regular, every 2-3 minutes/ MVU 160 SVE:   Dilation: 8 Effacement (%): 100 Station: Plus 1 Exam by:: Angela Russo CNM  Labs: Lab Results  Component Value Date   WBC 7.8 09/16/2019   HGB 12.2 09/16/2019   HCT 36.4 09/16/2019   MCV 87.3 09/16/2019   PLT 326 09/16/2019    Assessment / Plan: Induction of labor due to gestational hypertension,  progressing well on pitocin. Pt has a female circumcision and is concerned about lacerations.   Labor: Progressing normally on Pitocin  Gest HTN- Normotensive, suspicious of Pre-eclampsia w/ severe features Fetal Wellbeing:  Category I Pain Control:  Epidural I/D:  GBS negative, 4hrs since ROM Anticipated MOD:  NSVD   Will discuss exam and plan w/ Dr. Charlesetta Garibaldi.   Arrie Eastern MSN, CNM 09/17/2019, 8:34 AM

## 2019-09-17 NOTE — Progress Notes (Signed)
Subjective:   HA resolved w/ Tylenol, feeling pressure w/ ctx.   Objective:    CMP     Component Value Date/Time   NA 137 09/17/2019 0944   K 3.1 (L) 09/17/2019 0944   CL 104 09/17/2019 0944   CO2 19 (L) 09/17/2019 0944   GLUCOSE 71 09/17/2019 0944   BUN <5 (L) 09/17/2019 0944   CREATININE 0.75 09/17/2019 0944   CALCIUM 8.4 (L) 09/17/2019 0944   PROT 6.0 (L) 09/17/2019 0944   ALBUMIN 2.6 (L) 09/17/2019 0944   AST 38 09/17/2019 0944   ALT 29 09/17/2019 0944   ALKPHOS 562 (H) 09/17/2019 0944   BILITOT 1.9 (H) 09/17/2019 0944   GFRNONAA >60 09/17/2019 0944   GFRAA >60 09/17/2019 0944    VS: BP 121/76   Pulse 98   Temp 98 F (36.7 C) (Oral)   Resp 20   Ht 5\' 2"  (1.575 m)   Wt 49 kg   SpO2 100%   BMI 19.76 kg/m  FHR : baseline 125 / variability moderste / accelerations present / early decelerations Uterine activity: contractions every 2-4 minutes / MVU 160 Membranes: AROM @ 0430, clear Dilation: 9 Effacement (%): 100 Cervical Position: Posterior Station: Plus 2 Presentation: Vertex Exam by:: Cecelia Byars, RN Pitocin 12 mU/min  Assessment/Plan:   28 y.o. AGP@ [redacted]w[redacted]d Albumin 2.6     -Pharmacy recommends Albumin infusion to correct value     -Albumin infusion is not a common treatment for GHTN/pre-eclampsia, will hold to reduce potential fluid overload Labor: Progressing normally on Pitocin Preeclampsia: w/o severe features, intake and ouput balanced and labs stable  Fetal Wellbeing:  Category I Pain Control:  Epidural I/D:  GBS negative Anticipated MOD:  NSVD      -Cytotec 1000 mcg @ bedside for active mngt of 3rd stage  Exam and plan reviewed w/ Dr. Charlesetta Garibaldi.     Arrie Eastern CNM, MSN 09/17/2019 12:03 PM

## 2019-09-17 NOTE — Progress Notes (Signed)
Subjective: Postpartum Day # 1 : S/P NSVD due to IOL for GHTN: upon admission pt developed HA with PCR of 0.34, had an uncomplicated delivery. Was DX with preeclampsia with severe features for HA, and was placed on 24 hours of postpartum magnesium. Currently pt denies HA, vision changes or RUQ pain. Patient up ad lib, denies syncope or dizziness. Reports consuming regular diet without issues and denies N/V. Patient reports 0 bowel movement + passing flatus.  Denies issues with urination and reports bleeding is "light."  Patient is breastfeeding and reports going well.  Desires undecided for postpartum contraception.  Pain is being appropriately managed with use of po meds.   Female circ episiotomy laceration, hemostatic and not repaired, but dermabond applied.  Feeding:  breast Contraceptive plan:  undecided Baby Female  Objective: Vital signs in last 24 hours: Patient Vitals for the past 24 hrs:  BP Temp Temp src Pulse Resp SpO2  09/18/19 0700 - - - - 18 -  09/18/19 0600 - - - - 17 -  09/18/19 0500 - - - - 18 -  09/18/19 0400 107/68 98.4 F (36.9 C) Oral (!) 106 17 100 %  09/18/19 0301 111/67 - - 87 16 100 %  09/18/19 0201 130/88 98.4 F (36.9 C) Oral 93 16 100 %  09/18/19 0107 (!) 145/96 98.4 F (36.9 C) Oral 91 18 100 %  09/18/19 0101 (!) 164/98 98 F (36.7 C) Oral 94 18 100 %  09/18/19 0100 - - - - 18 -  09/18/19 0001 120/84 97.8 F (36.6 C) Oral (!) 109 16 100 %  09/17/19 2301 129/83 97.8 F (36.6 C) Oral 91 16 100 %  09/17/19 2300 - - - - 19 -  09/17/19 2206 - - - - - 100 %  09/17/19 2202 137/84 (!) 97.2 F (36.2 C) Oral (!) 110 18 99 %  09/17/19 2200 - - - - 18 99 %  09/17/19 2133 121/76 - - 91 18 100 %  09/17/19 2130 - - - - 18 100 %  09/17/19 2033 (!) 147/85 97.9 F (36.6 C) Oral 89 18 100 %  09/17/19 2002 - 97.9 F (36.6 C) Oral - - -  09/17/19 1938 (!) 126/99 - - (!) 104 17 -  09/17/19 1921 - - - - - 100 %  09/17/19 1846 119/79 - - 97 18 100 %  09/17/19 1830  123/74 - - 94 20 -  09/17/19 1815 (!) 133/95 - - (!) 111 18 -  09/17/19 1800 (!) 142/87 - - (!) 105 (!) 21 -  09/17/19 1745 134/87 - - (!) 104 20 -  09/17/19 1730 (!) 163/86 - - (!) 111 20 -  09/17/19 1720 - 97.9 F (36.6 C) Oral - - -  09/17/19 1716 (!) 150/102 - - (!) 125 20 -  09/17/19 1632 (!) 143/88 - - 97 18 -  09/17/19 1600 (!) 137/94 - - 98 - -  09/17/19 1530 134/78 - - 87 20 -  09/17/19 1500 140/89 - - (!) 107 18 -  09/17/19 1430 124/79 - - (!) 101 18 -  09/17/19 1400 124/77 - - 90 18 -  09/17/19 1330 127/88 - - 95 20 -  09/17/19 1301 122/83 - - (!) 104 18 -  09/17/19 1230 121/83 97.7 F (36.5 C) Axillary 100 18 -  09/17/19 1200 119/78 - - 96 20 -  09/17/19 1130 121/76 - - 98 20 -  09/17/19 1100 129/81 - - 94 18 -  09/17/19 1030 118/78 98 F (36.7 C) Oral 97 20 -  09/17/19 1000 129/83 - - 93 18 -  09/17/19 0930 131/86 - - 92 18 -  09/17/19 0900 (!) 137/99 - - 99 20 -  09/17/19 0830 (!) 123/92 - - 94 18 -  09/17/19 0815 - 98 F (36.7 C) Oral - - -  09/17/19 0800 130/85 - - 88 18 -  09/17/19 0730 120/82 - - 91 17 -     Physical Exam:  General: alert, cooperative, appears stated age and no distress Mood/Affect: Happy Lungs: clear to auscultation, no wheezes, rales or rhonchi, symmetric air entry.  Heart: normal rate, regular rhythm, normal S1, S2, no murmurs, rubs, clicks or gallops. Breast: breasts appear normal, no suspicious masses, no skin or nipple changes or axillary nodes. Abdomen:  + bowel sounds, soft, non-tender GU: perineum healing well. No signs of external hematomas.  Uterine Fundus: firm Lochia: appropriate Skin: Warm, Dry. DVT Evaluation: No evidence of DVT seen on physical exam. Negative Homan's sign. No cords or calf tenderness. No significant calf/ankle edema. 2+ Patellar DTR, no clonus   CBC Latest Ref Rng & Units 09/18/2019 09/17/2019 09/17/2019  WBC 4.0 - 10.5 K/uL 23.4(H) 23.5(H) 14.3(H)  Hemoglobin 12.0 - 15.0 g/dL 10.0(L) 11.7(L) 11.8(L)   Hematocrit 36.0 - 46.0 % 29.8(L) 35.6(L) 34.2(L)  Platelets 150 - 400 K/uL 231 280 279    Results for orders placed or performed during the hospital encounter of 09/16/19 (from the past 24 hour(s))  CBC with Differential/Platelet     Status: Abnormal   Collection Time: 09/17/19  9:44 AM  Result Value Ref Range   WBC 14.3 (H) 4.0 - 10.5 K/uL   RBC 4.01 3.87 - 5.11 MIL/uL   Hemoglobin 11.8 (L) 12.0 - 15.0 g/dL   HCT 84.134.2 (L) 32.436.0 - 40.146.0 %   MCV 85.3 80.0 - 100.0 fL   MCH 29.4 26.0 - 34.0 pg   MCHC 34.5 30.0 - 36.0 g/dL   RDW 02.712.9 25.311.5 - 66.415.5 %   Platelets 279 150 - 400 K/uL   nRBC 0.0 0.0 - 0.2 %   Neutrophils Relative % 78 %   Neutro Abs 11.1 (H) 1.7 - 7.7 K/uL   Lymphocytes Relative 7 %   Lymphs Abs 1.0 0.7 - 4.0 K/uL   Monocytes Relative 15 %   Monocytes Absolute 2.1 (H) 0.1 - 1.0 K/uL   Eosinophils Relative 0 %   Eosinophils Absolute 0.0 0.0 - 0.5 K/uL   Basophils Relative 0 %   Basophils Absolute 0.0 0.0 - 0.1 K/uL   Immature Granulocytes 0 %   Abs Immature Granulocytes 0.06 0.00 - 0.07 K/uL  Comprehensive metabolic panel     Status: Abnormal   Collection Time: 09/17/19  9:44 AM  Result Value Ref Range   Sodium 137 135 - 145 mmol/L   Potassium 3.1 (L) 3.5 - 5.1 mmol/L   Chloride 104 98 - 111 mmol/L   CO2 19 (L) 22 - 32 mmol/L   Glucose, Bld 71 70 - 99 mg/dL   BUN <5 (L) 6 - 20 mg/dL   Creatinine, Ser 4.030.75 0.44 - 1.00 mg/dL   Calcium 8.4 (L) 8.9 - 10.3 mg/dL   Total Protein 6.0 (L) 6.5 - 8.1 g/dL   Albumin 2.6 (L) 3.5 - 5.0 g/dL   AST 38 15 - 41 U/L   ALT 29 0 - 44 U/L   Alkaline Phosphatase 562 (H) 38 - 126 U/L   Total  Bilirubin 1.9 (H) 0.3 - 1.2 mg/dL   GFR calc non Af Amer >60 >60 mL/min   GFR calc Af Amer >60 >60 mL/min   Anion gap 14 5 - 15  Uric acid     Status: None   Collection Time: 09/17/19  9:44 AM  Result Value Ref Range   Uric Acid, Serum 6.7 2.5 - 7.1 mg/dL  Lactate dehydrogenase     Status: Abnormal   Collection Time: 09/17/19  9:44 AM   Result Value Ref Range   LDH 199 (H) 98 - 192 U/L  CBC     Status: Abnormal   Collection Time: 09/17/19  7:01 PM  Result Value Ref Range   WBC 23.5 (H) 4.0 - 10.5 K/uL   RBC 4.07 3.87 - 5.11 MIL/uL   Hemoglobin 11.7 (L) 12.0 - 15.0 g/dL   HCT 35.6 (L) 36.0 - 46.0 %   MCV 87.5 80.0 - 100.0 fL   MCH 28.7 26.0 - 34.0 pg   MCHC 32.9 30.0 - 36.0 g/dL   RDW 13.2 11.5 - 15.5 %   Platelets 280 150 - 400 K/uL   nRBC 0.0 0.0 - 0.2 %  CBC     Status: Abnormal   Collection Time: 09/18/19  6:03 AM  Result Value Ref Range   WBC 23.4 (H) 4.0 - 10.5 K/uL   RBC 3.47 (L) 3.87 - 5.11 MIL/uL   Hemoglobin 10.0 (L) 12.0 - 15.0 g/dL   HCT 29.8 (L) 36.0 - 46.0 %   MCV 85.9 80.0 - 100.0 fL   MCH 28.8 26.0 - 34.0 pg   MCHC 33.6 30.0 - 36.0 g/dL   RDW 13.1 11.5 - 15.5 %   Platelets 231 150 - 400 K/uL   nRBC 0.0 0.0 - 0.2 %  Comprehensive metabolic panel     Status: Abnormal   Collection Time: 09/18/19  6:03 AM  Result Value Ref Range   Sodium 136 135 - 145 mmol/L   Potassium 3.3 (L) 3.5 - 5.1 mmol/L   Chloride 107 98 - 111 mmol/L   CO2 21 (L) 22 - 32 mmol/L   Glucose, Bld 95 70 - 99 mg/dL   BUN <5 (L) 6 - 20 mg/dL   Creatinine, Ser 0.60 0.44 - 1.00 mg/dL   Calcium 7.7 (L) 8.9 - 10.3 mg/dL   Total Protein 5.0 (L) 6.5 - 8.1 g/dL   Albumin 2.1 (L) 3.5 - 5.0 g/dL   AST 37 15 - 41 U/L   ALT 28 0 - 44 U/L   Alkaline Phosphatase 386 (H) 38 - 126 U/L   Total Bilirubin 1.1 0.3 - 1.2 mg/dL   GFR calc non Af Amer >60 >60 mL/min   GFR calc Af Amer >60 >60 mL/min   Anion gap 8 5 - 15     CBG (last 3)  No results for input(s): GLUCAP in the last 72 hours.   I/O last 3 completed shifts: In: 8954.5 [P.O.:4280; I.V.:4674.5] Out: 7300 [Urine:7100; Blood:200]   Assessment Postpartum Day # 1 : S/P NSVD due to IOL for GHTN, then development of preeclampsia with severe features. Pt stable. -1 involution. breastfeeding. Hemodynamically stable with hgb drop from 11.8-11.7 post delivery. Preeclampsia with  severe feature: BP 107/68, asymptomatic now, admission pcr was 0.34, elevated AST at 47, on 10/6 AST decreased to 38, all other labs unremarkable. Labs this morning were Pending still. I&O adfaquate.  Plan: Continue other mgmt as ordered CMP: pending  Preeclampsia with severe features: Continue  magnesium x24 hours, d/c @ 2100 tonight, Q4H Bps, Q4H neuro checks, Strict IO.   VTE prophylactics: Early ambulated as tolerates.  Pain control: Motrin/Tylenol PRN Education given regarding options for contraception, including barrier methods, injectable contraception, IUD placement, oral contraceptives.  Plan for discharge tomorrow, Breastfeeding and Lactation consult   Dr. Sallye Ober to be updated on patient status when she assumes care at 0700  [facility] NP-C, CNM 09/18/2019, 7:18 AM

## 2019-09-18 DIAGNOSIS — O1490 Unspecified pre-eclampsia, unspecified trimester: Secondary | ICD-10-CM | POA: Diagnosis not present

## 2019-09-18 LAB — CBC
HCT: 29.8 % — ABNORMAL LOW (ref 36.0–46.0)
Hemoglobin: 10 g/dL — ABNORMAL LOW (ref 12.0–15.0)
MCH: 28.8 pg (ref 26.0–34.0)
MCHC: 33.6 g/dL (ref 30.0–36.0)
MCV: 85.9 fL (ref 80.0–100.0)
Platelets: 231 10*3/uL (ref 150–400)
RBC: 3.47 MIL/uL — ABNORMAL LOW (ref 3.87–5.11)
RDW: 13.1 % (ref 11.5–15.5)
WBC: 23.4 10*3/uL — ABNORMAL HIGH (ref 4.0–10.5)
nRBC: 0 % (ref 0.0–0.2)

## 2019-09-18 LAB — COMPREHENSIVE METABOLIC PANEL
ALT: 28 U/L (ref 0–44)
AST: 37 U/L (ref 15–41)
Albumin: 2.1 g/dL — ABNORMAL LOW (ref 3.5–5.0)
Alkaline Phosphatase: 386 U/L — ABNORMAL HIGH (ref 38–126)
Anion gap: 8 (ref 5–15)
BUN: <5 mg/dL — ABNORMAL LOW (ref 6–20)
CO2: 21 mmol/L — ABNORMAL LOW (ref 22–32)
Calcium: 7.7 mg/dL — ABNORMAL LOW (ref 8.9–10.3)
Chloride: 107 mmol/L (ref 98–111)
Creatinine, Ser: 0.6 mg/dL (ref 0.44–1.00)
GFR calc Af Amer: >60 mL/min (ref >60)
GFR calc non Af Amer: >60 mL/min (ref >60)
Glucose, Bld: 95 mg/dL (ref 70–99)
Potassium: 3.3 mmol/L — ABNORMAL LOW (ref 3.5–5.1)
Sodium: 136 mmol/L (ref 135–145)
Total Bilirubin: 1.1 mg/dL (ref 0.3–1.2)
Total Protein: 5 g/dL — ABNORMAL LOW (ref 6.5–8.1)

## 2019-09-18 NOTE — Anesthesia Postprocedure Evaluation (Signed)
Anesthesia Post Note  Patient: Angela Russo  Procedure(s) Performed: AN AD [place]     Patient location during evaluation: OB High Risk Anesthesia Type: Epidural Level of consciousness: awake and alert Pain management: pain level controlled Vital Signs Assessment: post-procedure vital signs reviewed and stable Respiratory status: spontaneous breathing, nonlabored ventilation and respiratory function stable Cardiovascular status: stable Postop Assessment: no headache, no backache and epidural receding Anesthetic complications: no    Last Vitals:  Vitals:   09/18/19 0600 09/18/19 0700  BP:    Pulse:    Resp: 17 18  Temp:    SpO2:      Last Pain:  Vitals:   09/18/19 0750  TempSrc:   PainSc: 0-No pain   Pain Goal:                Epidural/Spinal Function Cutaneous sensation: Normal sensation (09/18/19 0750)  Gilmer Mor

## 2019-09-18 NOTE — Anesthesia Postprocedure Evaluation (Signed)
Anesthesia Post Note  Patient: Angela Russo  Procedure(s) Performed: AN AD HOC LABOR EPIDURAL     Patient location during evaluation: Mother Baby Anesthesia Type: Epidural Level of consciousness: awake and alert Pain management: pain level controlled Vital Signs Assessment: post-procedure vital signs reviewed and stable Respiratory status: spontaneous breathing, nonlabored ventilation and respiratory function stable Cardiovascular status: stable Postop Assessment: no headache, no backache and epidural receding Anesthetic complications: no    Last Vitals:  Vitals:   09/18/19 0700 09/18/19 0803  BP:  128/83  Pulse:  91  Resp: 18 18  Temp:  36.4 C  SpO2:  100%    Last Pain:  Vitals:   09/18/19 0803  TempSrc: Oral  PainSc:    Pain Goal:                   [name]

## 2019-09-18 NOTE — Lactation Note (Signed)
This note was copied from a baby's chart. Lactation Consultation Note  Patient Name: Angela Russo [id number]'T Date: 09/18/2019 Reason for consult: Initial assessment;1st time breastfeeding;Primapara;Term;Other (Comment)(mom on Magnesium Sulfate)  Initial visit with P1 mom who delivered @ 06.2 wks, complicated by [facility]. Mom currently receiving Magnesium Sulfate IV infusion. Randel Books is now 47 hours old. English is not mom's primary language but mom states she does not need an interpreter and communication exchange adequate during [place] visit. Mom states baby has been latching well and denies any pain with latch, but does report cramping during feeding.  LC entered room to find mom awake in bed with baby STS. Mom states she is drowsy from Magnesium but receptive to teaching and assistance from [facility] at this time. Mom with small breast and very little areolar tissue, but large erect nipples at rest. Breasts soft and compressible. Demonstrated hand expression and glistening noted on nipples. Mom performed return demonstration. Infant latched to right breast in cross cradle hold.  Slight difficulty and a few attempts required to get all of nipple in baby's small mouth. Reviewed positioning at the breast chest to chest with nose/chin touching breast during feed. Demonstrated stimulating baby to open mouth using mom's nipple. Infant rooting and feeding cues reviewed. Reinforced to mom to ensure all of nipple in baby's mouth and baby with flanged lips and as wide as angle as possible. Infant latched x15 min and suckling continued upon [place] exit.  Reviewed feeding baby 8-12 times in 24 hours and with feeding cues; cues reviewed. Reviewed ways to wake a sleepy baby and keep baby awake during feeding such as STS contact, changing diaper, hand expression prior to latching.  Unopened DEBP kit at mom's beside. Recommended mom use DEBP after feedings due to Magnesium Sulfate treatment and possible delay in mature milk  production. Pump assembly and disassembly reviewed, cleaning methods demonstrated.  Reviewed pump function and initiation setting. Reviewed proper flange fit and demonstrated using LC finger in pump flange. Mom states she received a DEBP from a "clinic" but she can't recall name of pump. Instructed mom to take DEBP kit home with her at discharge.  Reviewed IP/OP lactation services and brochure with phone numbers provided.   Maternal Data Has patient been taught Hand Expression?: Yes Does the patient have breastfeeding experience prior to this delivery?: No  Feeding Feeding Type: Breast Fed  LATCH Score Latch: Grasps breast easily, tongue down, lips flanged, rhythmical sucking.  Audible Swallowing: A few with stimulation  Type of Nipple: Everted at rest and after stimulation  Comfort (Breast/Nipple): Soft / non-tender  Hold (Positioning): Assistance needed to correctly position infant at breast and maintain latch.  LATCH Score: 8  Interventions Interventions: Breast feeding basics reviewed;Assisted with latch;Skin to skin;Breast massage;Hand express;DEBP;Position options;Adjust position  Lactation Tools Discussed/Used Pump Review: Setup, frequency, and cleaning Initiated by:: ceanes Date initiated:: 09/18/19   Consult Status Consult Status: Follow-up Date: 09/19/19 Follow-up type: In-patient    Cranston Neighbor 09/18/2019, 10:51 AM

## 2019-09-18 NOTE — Progress Notes (Signed)
Patient ID: Angela Russo, female   DOB: 12-Oct-1991, 28 y.o.   MRN: [id number]  Subjective: Postpartum Day 2 Vaginal Delivery Patient reports no complaints.  She denies nausea, vomiting, headache, vision changes, chest pain, shortness of breath or abdominal pain.  She has scant lochia.      Objective: Vital signs:  Vitals:   09/18/19 0600 09/18/19 0700 09/18/19 0803 09/18/19 1220  BP:   128/83 114/77  Pulse:   91 85  Resp: 17 18 18 17   Temp:   97.6 F (36.4 C) (!) 97.5 F (36.4 C)  TempSrc:   Oral Oral  SpO2:   100%   Weight:      Height:       Intake/Output   Report View Graph   10/05 0700  10/06 0659 10/06 0700  10/07 0659 10/07 0700  10/08 0659  P.O.  3440 2640  I.V. (mL/kg) 1035.6 (21.1) 4474.5 (91.3) 200 (4.1)  Other  0   IV Piggyback 500 0   Total Intake(mL/kg) 1535.6 (31.3) 7914.5 (161.5) 2840 (58)  Urine (mL/kg/hr) 1400 4800 (4.1) 3550 (9.9)  Emesis/NG output 0    Blood  200   Total Output 1400 5000 3550  Net +135.6 +2914.5 -710       Urine Occurrence  1 x   Emesis Occurrence 1 x     Physical Exam:  General: No acute distress CVS: S1, S2, RRR PULMONARY: Clear to auscultation bilaterally ABDOMEN: Soft, non tender, non distended.  Lochia: Appropriate  Uterine Fundus: Firm DVT Evaluation: Warm and well perfused lower extremities, no edema or calf tenderness bilaterally. 1+ patellar reflex bilaterally.    Labs: CBC    Component Value Date/Time   WBC 23.4 (H) 09/18/2019 0603   RBC 3.47 (L) 09/18/2019 0603   HGB 10.0 (L) 09/18/2019 0603   HCT 29.8 (L) 09/18/2019 0603   PLT 231 09/18/2019 0603   MCV 85.9 09/18/2019 0603   MCH 28.8 09/18/2019 0603   MCHC 33.6 09/18/2019 0603   RDW 13.1 09/18/2019 0603   LYMPHSABS 1.0 09/17/2019 0944   MONOABS 2.1 (H) 09/17/2019 0944   EOSABS 0.0 09/17/2019 0944   BASOSABS 0.0 09/17/2019 0944   CMP Latest Ref Rng & Units 09/18/2019 09/17/2019 09/16/2019  Glucose 70 - 99 mg/dL 95 71 53(L)  BUN 6 - 20 mg/dL <5(L) <5(L)  5(L)  Creatinine 0.44 - 1.00 mg/dL 0.60 0.75 0.67  Sodium 135 - 145 mmol/L 136 137 136  Potassium 3.5 - 5.1 mmol/L 3.3(L) 3.1(L) 3.7  Chloride 98 - 111 mmol/L 107 104 101  CO2 22 - 32 mmol/L 21(L) 19(L) 19(L)  Calcium 8.9 - 10.3 mg/dL 7.7(L) 8.4(L) 8.7(L)  Total Protein 6.5 - 8.1 g/dL 5.0(L) 6.0(L) 6.9  Total Bilirubin 0.3 - 1.2 mg/dL 1.1 1.9(H) 1.2  Alkaline Phos 38 - 126 U/L 386(H) 562(H) 652(H)  AST 15 - 41 U/L 37 38 47(H)  ALT 0 - 44 U/L 28 29 31     Meds Current:   Current Facility-Administered Medications:  .  acetaminophen (TYLENOL) tablet 650 mg, 650 mg, Oral, Q4H PRN, Burman Foster B, CNM, 650 mg at 09/18/19 1126 .  benzocaine-Menthol (DERMOPLAST) 20-0.5 % topical spray 1 application, 1 application, Topical, PRN, Arrie Eastern, CNM .  coconut oil, 1 application, Topical, PRN, Arrie Eastern, CNM .  witch hazel-glycerin (TUCKS) pad 1 application, 1 application, Topical, PRN **AND** dibucaine (NUPERCAINAL) 1 % rectal ointment 1 application, 1 application, Rectal, PRN, Arrie Eastern, CNM .  diphenhydrAMINE (BENADRYL)  capsule 25 mg, 25 mg, Oral, Q6H PRN, Rhea Pink B, CNM .  magnesium sulfate 40 grams in SWI 1000 mL OB infusion, 2 g/hr, Intravenous, Continuous, [place], [place], [place], Last Rate: 50 mL/hr at 09/18/19 1316, 2 g/hr at 09/18/19 1316 .  ondansetron (ZOFRAN) tablet 4 mg, 4 mg, Oral, Q4H PRN **OR** ondansetron (ZOFRAN) injection 4 mg, 4 mg, Intravenous, Q4H PRN, Roma Schanz, CNM .  oxyCODONE (Oxy IR/ROXICODONE) immediate release tablet 5 mg, 5 mg, Oral, Q4H PRN, Dale [place], FNP .  prenatal multivitamin tablet 1 tablet, 1 tablet, Oral, Q1200, Roma Schanz, CNM, 1 tablet at 09/18/19 1124 .  senna-docusate (Senokot-S) tablet 2 tablet, 2 tablet, Oral, Q24H, Roma Schanz, CNM, 2 tablet at 09/17/19 2337 .  simethicone (MYLICON) chewable tablet 80 mg, 80 mg, Oral, PRN, Roma Schanz, CNM .  Tdap (BOOSTRIX) injection 0.5 mL, 0.5 mL, Intramuscular, Once, Roma Schanz, CNM  Facility-Administered Medications Ordered in Other Encounters:  .  fentaNYL (SUBLIMAZE) 500 mcg, bupivacaine (SENSORCAINE-MPF) 41.67 mL in sodium chloride (PF) 0.9 % 250 mL epidural, , , Continuous PRN, Woodrum, Chelsey L, MD, Last Rate: 10 mL/hr at 09/16/19 1753, 10 mL/hr at 09/16/19 1753 .  lidocaine-EPINEPHrine (XYLOCAINE W/EPI) 2 %-1:200000 (PF) injection, , Epidural, Anesthesia Intra-op, Woodrum, Chelsey L, MD, 1 mL at 09/16/19 1752  Assessment/Plan: Status postpartum day 1, vaginal delivery   Preeclampsia with severe features: S/p delivery.  Serial labs show improvement and normal values.  Plan to stop magnesium sulfate on 10/7 at 2100.   HTN: Well controlled on no medication.  Routine postpartum care.   Konrad Felix, MD.  09/18/2019. 1415.

## 2019-09-18 NOTE — Lactation Note (Addendum)
This note was copied from a baby's chart. Lactation Consultation Note  Patient Name: Angela Russo [id number] Date: 09/18/2019 Reason for consult: Follow-up assessment;Primapara;1st time breastfeeding;Term;Mother's request  1901 - 1906 - Ms. Cline requested lactation. When I arrived in her room, she stated that her RN was able to assist with breast feeding. She reports that her daughter is latching better today. She declined assistance at this visit.  I reviewed breast feeding basics and educated on day 2 infant feeding patterns and cluster feeding behavior and feeding on demand. I reviewed feeding cues with Ms. Quevedo and her support person. I recommended breast feeding 8-12 times a day on demand and wake baby to feed as needed.  When asked if Ms. Polinsky was pumping, she stated that she is doing breast and bottle. It appeared that she was not pumping.  I encouraged Ms. Weninger to pump as was discussed in earlier [facility] visit today.  No further questions or concerns.   Interventions Interventions: Breast feeding basics reviewed   Consult Status Consult Status: Follow-up Date: 09/19/19 Follow-up type: In-patient    Lenore Manner 09/18/2019, 8:48 PM

## 2019-09-19 MED ORDER — SENNOSIDES-DOCUSATE SODIUM 8.6-50 MG PO TABS: 2.0000 | ORAL_TABLET | ORAL | 0 refills | Status: AC

## 2019-09-19 NOTE — Lactation Note (Signed)
This note was copied from a baby's chart. Lactation Consultation Note  Patient Name: Angela Russo [id number] Date: 09/19/2019 Reason for consult: Follow-up assessment;Term;1st time breastfeeding;Primapara  P1 mother whose infant is now 68 hours old.  This is a term baby.  Mother's preferred language is Arabic, however, she requested no interpreter services.  Baby was asleep in mother's arms when I arrived.  Mother's feeding preference on admission was breast/bottle.  Mother has been primarily bottle feeding.  She has a DEBP set up at bedside but has not used it.  Family will be discharged today.  Mother's breasts are small with little breast tissue.  Her nipples are large and everted and intact.  Mother stated her nipple on the left side hurts when baby feeds, however, no redness or breakdown noted.  I suggested ways to be sure baby is opening with a wide gape and mother is placing baby deep into breast tissue.  Mother has been feeding on cue and supplementing with formula.  While I was in the room with family I received a call from the pediatrician that mother would like breast feeding assistance.  However, at this time, baby is sleeping and it has only been 2 hours since she last fed.  Suggested mother call me when baby shows cues and I will assist with latching.  Mother verbalized understanding.    Mother has a DEBP for home use.  Father present and supportive.   Maternal Data Formula Feeding for Exclusion: Yes Reason for exclusion: Mother's choice to formula and breast feed on admission Has patient been taught Hand Expression?: Yes Does the patient have breastfeeding experience prior to this delivery?: No  Feeding    LATCH Score                   Interventions    Lactation Tools Discussed/Used Pump Review: Setup, frequency, and cleaning;Milk Storage Initiated by:: [name] Date initiated:: 09/19/19   Consult Status Consult Status: Complete Date:  09/20/19 Follow-up type: Call as needed    Angela Russo R [name] 09/19/2019, 10:42 AM

## 2019-09-19 NOTE — Discharge Summary (Signed)
OB Discharge Summary     Patient Name: Angela Russo DOB: Dec 15, 1990 MRN: [id number]  Date of admission: 09/16/2019 Delivering MD: Burman Foster B   Date of discharge: 09/19/2019  Admitting diagnosis: 39WKS,CTX,VOMITING,HEADACHES Intrauterine pregnancy: [redacted]w[redacted]d     Secondary diagnosis:  Principal Problem:   SVD (10/6) Active Problems:   Gestational hypertension   Encounter for induction of labor   Preeclampsia  Additional problems: Female circumsion     Discharge diagnosis: Term Pregnancy Delivered and Preeclampsia (severe)                                                                                                Post partum procedures:Magnesium Sulfate therapy  Augmentation: AROM and Pitocin  Complications: None  Hospital course:  Induction of Labor With Vaginal Delivery   28 y.o. yo [id number] at [redacted]w[redacted]d was admitted to the hospital 09/16/2019 for induction of labor.  Indication for induction: Gestational hypertension and Preeclampsia.  Patient had an uncomplicated labor course as follows: Membrane Rupture Time/Date: 4:15 AM ,09/17/2019   Intrapartum Procedures: Episiotomy: None [1]                                         Lacerations:  2nd degree [3]  Patient had delivery of a Viable infant.  Information for the patient's newborn:  Donatella, Walski [[id number]]  Delivery Method: Vag-Spont    09/17/2019  Details of delivery can be found in separate delivery note.  Patient had a routine postpartum course. Patient is discharged home 09/19/19.  Physical exam  Vitals:   09/18/19 2317 09/19/19 0006 09/19/19 0417 09/19/19 0732  BP: 131/87 117/83 128/88 125/90  Pulse: 76 78 92 90  Resp: 15 16 16 17   Temp: 97.8 F (36.6 C) (!) 97.1 F (36.2 C) 98 F (36.7 C) 98.2 F (36.8 C)  TempSrc: Oral Oral Oral Oral  SpO2: 99% 100% 100% 100%  Weight:      Height:       General: alert, cooperative and no distress Lochia: appropriate Uterine Fundus: firm Perineum:  well-approximated, healing DVT Evaluation: No evidence of DVT seen on physical exam. No cords or calf tenderness. No significant calf/ankle edema. Labs: Lab Results  Component Value Date   WBC 23.4 (H) 09/18/2019   HGB 10.0 (L) 09/18/2019   HCT 29.8 (L) 09/18/2019   MCV 85.9 09/18/2019   PLT 231 09/18/2019   CMP Latest Ref Rng & Units 09/18/2019  Glucose 70 - 99 mg/dL 95  BUN 6 - 20 mg/dL <5(L)  Creatinine 0.44 - 1.00 mg/dL 0.60  Sodium 135 - 145 mmol/L 136  Potassium 3.5 - 5.1 mmol/L 3.3(L)  Chloride 98 - 111 mmol/L 107  CO2 22 - 32 mmol/L 21(L)  Calcium 8.9 - 10.3 mg/dL 7.7(L)  Total Protein 6.5 - 8.1 g/dL 5.0(L)  Total Bilirubin 0.3 - 1.2 mg/dL 1.1  Alkaline Phos 38 - 126 U/L 386(H)  AST 15 - 41 U/L 37  ALT 0 - 44 U/L 28    Discharge instruction:  per After Visit Summary and "Baby and Me Booklet".  After visit meds:  Allergies as of 09/19/2019   No Known Allergies     Medication List    STOP taking these medications   acetaminophen 500 MG tablet Commonly known as: TYLENOL   pantoprazole 40 MG tablet Commonly known as: PROTONIX   prenatal multivitamin Tabs tablet     TAKE these medications   senna-docusate 8.6-50 MG tablet Commonly known as: Senokot-S Take 2 tablets by mouth daily for 10 days. Start taking on: September 20, 2019       Diet: routine diet  Activity: Advance as tolerated. Pelvic rest for 6 weeks.   Outpatient follow up:1 week for BP check, 6 weeks for routine postpartum exam Follow up Appt:No future appointments. Follow up Visit:No follow-ups on file.  Postpartum contraception: Undecided and considering IUD, will decide at 6 week visit  Newborn Data: Live born female  Birth Weight: 6 lb 4 oz (2835 g) APGAR: 9, 9  Newborn Delivery   Birth date/time: 09/17/2019 17:08:00 Delivery type: Vaginal, Spontaneous      Baby Feeding: Breast Disposition:home with mother   Rhea Pink, MSN, CNM 09/19/2019 9:39 AM

## 2020-04-07 ENCOUNTER — Encounter (HOSPITAL_COMMUNITY): Payer: Self-pay

## 2020-04-07 ENCOUNTER — Ambulatory Visit (HOSPITAL_COMMUNITY)
Admission: EM | Admit: 2020-04-07 | Discharge: 2020-04-07 | Disposition: A | Discharge status: Home / Self Care | Payer: 59 | Attending: Family Medicine | Admitting: Family Medicine

## 2020-04-07 ENCOUNTER — Other Ambulatory Visit: Payer: Self-pay

## 2020-04-07 DIAGNOSIS — H00021 Hordeolum internum right upper eyelid: Secondary | ICD-10-CM | POA: Diagnosis not present

## 2020-04-07 MED ORDER — CEPHALEXIN 500 MG PO CAPS: 500.0000 mg | ORAL_CAPSULE | Freq: Four times a day (QID) | ORAL | 0 refills | Status: DC

## 2020-04-07 MED ORDER — ERYTHROMYCIN 5 MG/GM OP OINT: TOPICAL_OINTMENT | OPHTHALMIC | 0 refills | Status: DC

## 2020-04-07 NOTE — Discharge Instructions (Addendum)
Stye  -Warm compresses multiple times a day with massage afterward  - Do not wear contacts or make up  - Keep lid clean, may use baby shampoo diluted to gently wash along lid line - Keflex x 5 days, erythromycin ointment into lid - Return if increasing, redness, pain or swelling, decreased vision.

## 2020-04-07 NOTE — ED Triage Notes (Signed)
Patient reports she noticed the right eye swelling about one weeks ago. Reports it was more painful at first.

## 2020-04-08 ENCOUNTER — Telehealth (HOSPITAL_COMMUNITY): Payer: Self-pay | Admitting: Family Medicine

## 2020-04-08 MED ORDER — ERYTHROMYCIN 5 MG/GM OP OINT: TOPICAL_OINTMENT | OPHTHALMIC | 0 refills | Status: DC

## 2020-04-08 MED ORDER — CEPHALEXIN 500 MG PO CAPS: 500.0000 mg | ORAL_CAPSULE | Freq: Four times a day (QID) | ORAL | 0 refills | Status: AC

## 2020-04-08 NOTE — ED Provider Notes (Signed)
[facility]    CSN: [id number] Arrival date & time: 04/07/20  1141      History   Chief Complaint Chief Complaint  Patient presents with  . Eye Problem    HPI Angela Russo is a 29 y.o. female no significant past medical history presenting today for evaluation of right eye pain and swelling.  Patient notes over the past week she has had pain and swelling to her right upper lid.  Has worsened over the week.  She reports occasional blurry vision, changes in vision or eye pain.  Denies contact use.  Pain localized to lid.  Denies associated URI symptoms.  HPI  Past Medical History:  Diagnosis Date  . Medical history non-contributory     Patient Active Problem List   Diagnosis Date Noted  . SVD (10/6) 09/18/2019  . Preeclampsia 09/18/2019  . Gestational hypertension 09/16/2019  . Encounter for induction of labor 09/16/2019  . 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 APPENDECTOMY      OB History    Gravida  3   Para  1   Term  1   Preterm      AB  2   Living  1     SAB  2   TAB  0   Ectopic      Multiple  0   Live Births  1            Home Medications    Prior to Admission medications   Medication Sig Start Date End Date Taking? Authorizing Provider  cephALEXin (KEFLEX) 500 MG capsule Take 1 capsule (500 mg total) by mouth 4 (four) times daily for 5 days. 04/07/20 04/12/20  Carletta Feasel C, PA-C  erythromycin ophthalmic ointment Place a 1/2 inch ribbon of ointment into the lower eyelid 4 times daily x 1 week 04/07/20   Fedora Knisely, Junius Creamer, PA-C    Family History History reviewed. No pertinent family history.  Social History Social History   Tobacco Use  . Smoking status: Never Smoker  . Smokeless tobacco: Never Used  Substance Use Topics  . Alcohol use: Never  . Drug use: Never     Allergies   Patient has no known allergies.   Review of Systems Review of Systems    Constitutional: Negative for activity change, appetite change, chills, fatigue and fever.  HENT: Negative for congestion, ear pain, rhinorrhea, sinus pressure, sore throat and trouble swallowing.   Eyes: Positive for pain and redness. Negative for discharge.  Respiratory: Negative for cough, chest tightness and shortness of breath.   Cardiovascular: Negative for chest pain.  Gastrointestinal: Negative for abdominal pain, diarrhea, nausea and vomiting.  Musculoskeletal: Negative for myalgias.  Skin: Negative for rash.  Neurological: Negative for dizziness, light-headedness and headaches.     Physical Exam Triage Vital Signs ED Triage Vitals  Enc Vitals Group     BP 04/07/20 1203 113/84     Pulse Rate 04/07/20 1203 100     Resp 04/07/20 1203 12     Temp 04/07/20 1203 98.2 F (36.8 C)     Temp Source 04/07/20 1203 Oral     SpO2 04/07/20 1203 100 %     Weight --      Height --      Head Circumference --      Peak Flow --      Pain Score 04/07/20 1202 6     Pain Loc --  Pain Edu? --      Excl. in GC? --    No data found.  Updated Vital Signs BP 113/84 (BP Location: Right Arm)   Pulse 100   Temp 98.2 F (36.8 C) (Oral)   Resp 12   LMP 03/12/2020 (Exact Date)   SpO2 100%   Visual Acuity Right Eye Distance:   Left Eye Distance:   Bilateral Distance:    Right Eye Near:   Left Eye Near:    Bilateral Near:     Physical Exam Vitals and nursing note reviewed.  Constitutional:      Appearance: She is well-developed.     Comments: No acute distress  HENT:     Head: Normocephalic and atraumatic.     Nose: Nose normal.  Eyes:     Extraocular Movements: Extraocular movements intact.     Conjunctiva/sclera: Conjunctivae normal.     Pupils: Pupils are equal, round, and reactive to light.     Comments: Right eye: Upper outer lid with area of swelling and erythema, tender to touch, underlying conjunctiva appears erythematous, but otherwise conjunctiva, no obvious  drainage or discharge, swelling localized to around lash line, does not extend to infra orbital area or supraorbital area  Cardiovascular:     Rate and Rhythm: Normal rate.  Pulmonary:     Effort: Pulmonary effort is normal. No respiratory distress.  Abdominal:     General: There is no distension.  Musculoskeletal:        General: Normal range of motion.     Cervical back: Neck supple.  Skin:    General: Skin is warm and dry.  Neurological:     Mental Status: She is alert and oriented to person, place, and time.      UC Treatments / Results  Labs (all labs ordered are listed, but only abnormal results are displayed) Labs Reviewed - No data to display  EKG   Radiology No results found.  Procedures Procedures (including critical care time)  Medications Ordered in UC Medications - No data to display  Initial Impression / Assessment and Plan / UC Course  I have reviewed the triage vital signs and the nursing notes.  Pertinent labs & imaging results that were available during my care of the patient were reviewed by me and considered in my medical decision making (see chart for details).     Exam suggestive of stye, given persistence for a week we will go ahead and initiate on Keflex, providing erythromycin to help with any irritation triggered from stye on internum of the eye.  Warm compresses.  Monitor for gradual improvement.  No ocular involvement at this time.  Discussed strict return precautions. Patient verbalized understanding and is agreeable with plan.  Final Clinical Impressions(s) / UC Diagnoses   Final diagnoses:  Hordeolum internum of right upper eyelid     Discharge Instructions     Stye  -Warm compresses multiple times a day with massage afterward  - Do not wear contacts or make up  - Keep lid clean, may use baby shampoo diluted to gently wash along lid line - Keflex x 5 days, erythromycin ointment into lid - Return if increasing, redness, pain or  swelling, decreased vision.     ED Prescriptions    Medication Sig Dispense Auth. Provider   cephALEXin (KEFLEX) 500 MG capsule Take 1 capsule (500 mg total) by mouth 4 (four) times daily for 5 days. 20 capsule Daryl Quiros C, PA-C   erythromycin ophthalmic  ointment Place a 1/2 inch ribbon of ointment into the lower eyelid 4 times daily x 1 week 3.5 g Abdoulie Tierce, [place] C, PA-C     PDMP not reviewed this encounter.   Janith Lima, PA-C 04/08/20 1043

## 2020-04-08 NOTE — Telephone Encounter (Signed)
Report given to me that [facility] did not receive Rx given yesterday. Re-sent today.

## 2020-08-31 ENCOUNTER — Ambulatory Visit (HOSPITAL_COMMUNITY)
Admission: EM | Admit: 2020-08-31 | Discharge: 2020-08-31 | Disposition: A | Discharge status: Home / Self Care | Payer: 59 | Attending: Internal Medicine | Admitting: Internal Medicine

## 2020-08-31 ENCOUNTER — Other Ambulatory Visit: Payer: Self-pay

## 2020-08-31 ENCOUNTER — Encounter (HOSPITAL_COMMUNITY): Payer: Self-pay | Admitting: Emergency Medicine

## 2020-08-31 DIAGNOSIS — H1031 Unspecified acute conjunctivitis, right eye: Secondary | ICD-10-CM | POA: Diagnosis not present

## 2020-08-31 MED ORDER — POLYMYXIN B-TRIMETHOPRIM 10000-0.1 UNIT/ML-% OP SOLN: 1.0000 [drp] | OPHTHALMIC | 0 refills | Status: DC

## 2020-08-31 MED ORDER — FLUORESCEIN SODIUM 1 MG OP STRP
ORAL_STRIP | OPHTHALMIC | Status: AC
  Filled 2020-08-31: qty 2

## 2020-08-31 NOTE — Discharge Instructions (Addendum)
Use eyedrops as prescribed.  As we discussed you can do warm compresses with a washcloth 3-4 times daily to help relieve symptoms.  Please follow-up for any worsening pain, redness or change in vision

## 2020-08-31 NOTE — ED Triage Notes (Signed)
Right eye redness/itching for 6 days. Pain in right eye developed yesterday / headache yesterday as well.

## 2020-08-31 NOTE — ED Provider Notes (Signed)
[facility]    CSN: [id number] Arrival date & time: 08/31/20  1537      History   Chief Complaint Chief Complaint  Patient presents with  . Eye Problem    HPI Angela Russo is a 29 y.o. female reports to urgent care today with complaints of pain and itching to right eye.  Patient states symptoms present for 6 days with intermittent associated headache.  Patient denies any blurred vision or facial pain, no trauma.  Patient states she awakes in a.m. with crusting around eye.  Patient has not taken anything for symptoms.  She does not wear contact lenses.   Past Medical History:  Diagnosis Date  . Medical history non-contributory     Patient Active Problem List   Diagnosis Date Noted  . SVD (10/6) 09/18/2019  . Preeclampsia 09/18/2019  . Gestational hypertension 09/16/2019  . Encounter for induction of labor 09/16/2019  . 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 APPENDECTOMY      OB History    Gravida  3   Para  1   Term  1   Preterm      AB  2   Living  1     SAB  2   TAB  0   Ectopic      Multiple  0   Live Births  1            Home Medications    Prior to Admission medications   Medication Sig Start Date End Date Taking? Authorizing Provider  erythromycin ophthalmic ointment Place a 1/2 inch ribbon of ointment into the lower eyelid 4 times daily x 1 week 04/08/20   Mardella Layman, MD  trimethoprim-polymyxin b (POLYTRIM) ophthalmic solution Place 1 drop into the right eye every 4 (four) hours for 7 days. 08/31/20 09/07/20  Rolla Etienne, NP    Family History No family history on file.  Social History Social History   Tobacco Use  . Smoking status: Never Smoker  . Smokeless tobacco: Never Used  Vaping Use  . Vaping Use: Never used  Substance Use Topics  . Alcohol use: Never  . Drug use: Never     Allergies   Patient has no known allergies.   Review of Systems As  stated in HPI otherwise negative   Physical Exam Triage Vital Signs ED Triage Vitals  Enc Vitals Group     BP 08/31/20 1628 115/79     Pulse Rate 08/31/20 1628 93     Resp 08/31/20 1628 16     Temp 08/31/20 1628 98.5 F (36.9 C)     Temp Source 08/31/20 1628 Oral     SpO2 08/31/20 1628 100 %     Weight --      Height --      Head Circumference --      Peak Flow --      Pain Score 08/31/20 1627 8     Pain Loc --      Pain Edu? --      Excl. in GC? --    No data found.  Updated Vital Signs BP 115/79   Pulse 93   Temp 98.5 F (36.9 C) (Oral)   Resp 16   SpO2 100%   Visual Acuity Right Eye Distance: 20/30 (Without correction ) Left Eye Distance: 20/50 (Without correction ) Bilateral Distance: 20/30 (Without correction )  Right Eye Near:   Left  Eye Near:    Bilateral Near:     Physical Exam Eyes:     General: Lids are normal. Vision grossly intact.        Right eye: No foreign body.     Extraocular Movements: Extraocular movements intact.     Conjunctiva/sclera:     Right eye: Right conjunctiva is injected. No exudate.      UC Treatments / Results  Labs (all labs ordered are listed, but only abnormal results are displayed) Labs Reviewed - No data to display  EKG   Radiology No results found.  Procedures Procedures (including critical care time)  Medications Ordered in UC Medications - No data to display  Initial Impression / Assessment and Plan / UC Course  I have reviewed the triage vital signs and the nursing notes.  Pertinent labs & imaging results that were available during my care of the patient were reviewed by me and considered in my medical decision making (see chart for details).  Bacterial conjunctivitis, right eye -Fluorescein test strip no evidence of laceration or corneal abrasion -Relief of symptoms with tetracaine eyedrops were consistent with bacterial conjunctivitis -Polytrim eyedrops, warm compresses -Follow-up for worsening  or persistent symptoms  Final Clinical Impressions(s) / UC Diagnoses   Final diagnoses:  Acute bacterial conjunctivitis of right eye     Discharge Instructions     Use eyedrops as prescribed.  As we discussed you can do warm compresses with a washcloth 3-4 times daily to help relieve symptoms.  Please follow-up for any worsening pain, redness or change in vision    ED Prescriptions    Medication Sig Dispense Auth. Provider   trimethoprim-polymyxin b (POLYTRIM) ophthalmic solution Place 1 drop into the right eye every 4 (four) hours for 7 days. 10 mL Rolla Etienne, NP     PDMP not reviewed this encounter.   Rolla Etienne, NP 08/31/20 1931

## 2020-09-03 ENCOUNTER — Encounter (HOSPITAL_COMMUNITY): Payer: Self-pay

## 2020-09-03 ENCOUNTER — Other Ambulatory Visit: Payer: Self-pay

## 2020-09-03 ENCOUNTER — Ambulatory Visit (HOSPITAL_COMMUNITY)
Admission: EM | Admit: 2020-09-03 | Discharge: 2020-09-03 | Disposition: A | Discharge status: Home / Self Care | Payer: 59 | Attending: Family Medicine | Admitting: Family Medicine

## 2020-09-03 DIAGNOSIS — H15101 Unspecified episcleritis, right eye: Secondary | ICD-10-CM | POA: Diagnosis not present

## 2020-09-03 MED ORDER — FLUORESCEIN SODIUM 1 MG OP STRP
ORAL_STRIP | OPHTHALMIC | Status: AC
  Filled 2020-09-03: qty 1

## 2020-09-03 MED ORDER — TOBRAMYCIN-DEXAMETHASONE 0.3-0.1 % OP SUSP: 1.0000 [drp] | OPHTHALMIC | 0 refills | Status: DC

## 2020-09-03 MED ORDER — TETRACAINE HCL 0.5 % OP SOLN
OPHTHALMIC | Status: AC
  Filled 2020-09-03: qty 4

## 2020-09-03 NOTE — ED Notes (Signed)
Called phone number, no answer and mailbox is not set up

## 2020-09-03 NOTE — ED Triage Notes (Signed)
Pt c/o right eye pain, swelling, redness, for approx 8-10 days. Pt evaluated here on Monday for same c/c. States pain, redness have significantly increased despite using Rx drops. Also reports vision has been more affected and light is sensitive to light.   Right sclera with diffuse erythema, watering.

## 2020-09-03 NOTE — ED Provider Notes (Signed)
[facility]    CSN: [id number] Arrival date & time: 09/03/20  1414      History   Chief Complaint Chief Complaint  Patient presents with  . Eye Problem    HPI Angela Russo is a 29 y.o. female.   Patient presenting today with progressively worsening right eye redness, swelling, pain that has been ongoing the past 6 days. Was evaluated 3 days ago here at the urgent care for this and given polytrim drops for suspected bacterial conjunctivitis but states this did not improve sxs and they continue to worsen. Still having mild headache and photophobia. Denies fever, chills, injury or foreign body to eye, history of autoimmune disease or herpes. Fluorescein stain 3 days ago without evidence of corneal abrasion.      Past Medical History:  Diagnosis Date  . Medical history non-contributory     Patient Active Problem List   Diagnosis Date Noted  . SVD (10/6) 09/18/2019  . Preeclampsia 09/18/2019  . Gestational hypertension 09/16/2019  . Encounter for induction of labor 09/16/2019  . 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 APPENDECTOMY      OB History    Gravida  3   Para  1   Term  1   Preterm      AB  2   Living  1     SAB  2   TAB  0   Ectopic      Multiple  0   Live Births  1            Home Medications    Prior to Admission medications   Medication Sig Start Date End Date Taking? Authorizing Provider  erythromycin ophthalmic ointment Place a 1/2 inch ribbon of ointment into the lower eyelid 4 times daily x 1 week 04/08/20  Yes Hagler, Arlys John, MD  tobramycin-dexamethasone [facility]) ophthalmic solution Place 1 drop into the right eye every 4 (four) hours while awake. 09/03/20   Particia Nearing, PA-C    Family History History reviewed. No pertinent family history.  Social History Social History   Tobacco Use  . Smoking status: Never Smoker  . Smokeless tobacco: Never Used    Vaping Use  . Vaping Use: Never used  Substance Use Topics  . Alcohol use: Never  . Drug use: Never     Allergies   Patient has no known allergies.   Review of Systems Review of Systems PER  HPI   Physical Exam Triage Vital Signs ED Triage Vitals  Enc Vitals Group     BP 09/03/20 1648 112/79     Pulse Rate 09/03/20 1648 88     Resp 09/03/20 1648 18     Temp 09/03/20 1648 98.2 F (36.8 C)     Temp Source 09/03/20 1648 Oral     SpO2 09/03/20 1648 100 %     Weight --      Height --      Head Circumference --      Peak Flow --      Pain Score 09/03/20 1650 6     Pain Loc --      Pain Edu? --      Excl. in GC? --    No data found.  Updated Vital Signs BP 112/79 (BP Location: Left Arm)   Pulse 88   Temp 98.2 F (36.8 C) (Oral)   Resp 18   LMP 05/17/2020   SpO2  100%   Visual Acuity Right Eye Distance: 20/30 Left Eye Distance: 20/40 Bilateral Distance: 20/25  Right Eye Near:   Left Eye Near:    Bilateral Near:     Physical Exam Vitals and nursing note reviewed.  Constitutional:      Appearance: Normal appearance. She is not ill-appearing.  HENT:     Head: Atraumatic.     Nose: Nose normal.     Mouth/Throat:     Mouth: Mucous membranes are moist.     Pharynx: Oropharynx is clear.  Eyes:     Comments: Left eye appears benign, right eye diffusely erythematous, edematous and injected - worse on lateral aspect of eye  EOMs intact and smooth, controlled. PERRL b/l No periorbital erythema, edema or rashes  Cardiovascular:     Rate and Rhythm: Normal rate and regular rhythm.     Heart sounds: Normal heart sounds.  Pulmonary:     Effort: Pulmonary effort is normal.     Breath sounds: Normal breath sounds.  Musculoskeletal:        General: Normal range of motion.     Cervical back: Normal range of motion and neck supple.  Skin:    General: Skin is warm and dry.  Neurological:     Mental Status: She is alert and oriented to person, place, and time.   Psychiatric:        Mood and Affect: Mood normal.        Thought Content: Thought content normal.        Judgment: Judgment normal.    UC Treatments / Results  Labs (all labs ordered are listed, but only abnormal results are displayed) Labs Reviewed - No data to display  EKG   Radiology No results found.  Procedures Procedures (including critical care time)  Medications Ordered in UC Medications - No data to display  Initial Impression / Assessment and Plan / UC Course  I have reviewed the triage vital signs and the nursing notes.  Pertinent labs & imaging results that were available during my care of the patient were reviewed by me and considered in my medical decision making (see chart for details).     Suspect episcleritis vs progressively worsening bacterial conjunctivitis - will switch from polytrim to tobradex drops, start ibuprofen TID prn for pain and inflammation and follow up as outpatient in the AM with Ophthalmology. Discussed at length to go directly to ED for any worsening sxs overnight, including loss of vision, severe worsening pain, etc.   Final Clinical Impressions(s) / UC Diagnoses   Final diagnoses:  Episcleritis of right eye     Discharge Instructions     [facility] [address] [place], [place] [phone]  Call tomorrow morning first thing to see about getting an appointment tomorrow to be seen.     ED Prescriptions    Medication Sig Dispense Auth. Provider   tobramycin-dexamethasone [facility]) ophthalmic solution Place 1 drop into the right eye every 4 (four) hours while awake. 5 mL Particia Nearing, [place]     PDMP not reviewed this encounter.   Particia Nearing, [place] 09/03/20 1739

## 2020-09-03 NOTE — Discharge Instructions (Signed)
[facility] [address] [place], [place] [phone]  Call tomorrow morning first thing to see about getting an appointment tomorrow to be seen.

## 2021-10-11 LAB — HEPATITIS C ANTIBODY: HCV Ab: NEGATIVE

## 2021-10-11 LAB — OB RESULTS CONSOLE HIV ANTIBODY (ROUTINE TESTING): HIV: NONREACTIVE

## 2021-10-11 LAB — OB RESULTS CONSOLE RUBELLA ANTIBODY, IGM: Rubella: IMMUNE

## 2021-10-11 LAB — OB RESULTS CONSOLE HEPATITIS B SURFACE ANTIGEN: Hepatitis B Surface Ag: NEGATIVE

## 2021-10-17 ENCOUNTER — Inpatient Hospital Stay (HOSPITAL_COMMUNITY): Payer: Medicaid Other

## 2021-10-17 ENCOUNTER — Inpatient Hospital Stay (HOSPITAL_COMMUNITY)
Admission: AD | Admit: 2021-10-17 | Discharge: 2021-10-17 | Disposition: A | Discharge status: Home / Self Care | Payer: Medicaid Other | Attending: Obstetrics & Gynecology | Admitting: Obstetrics & Gynecology

## 2021-10-17 ENCOUNTER — Other Ambulatory Visit: Payer: Self-pay

## 2021-10-17 ENCOUNTER — Encounter (HOSPITAL_COMMUNITY): Payer: Self-pay | Admitting: Obstetrics & Gynecology

## 2021-10-17 DIAGNOSIS — O26899 Other specified pregnancy related conditions, unspecified trimester: Secondary | ICD-10-CM

## 2021-10-17 DIAGNOSIS — R109 Unspecified abdominal pain: Secondary | ICD-10-CM | POA: Diagnosis not present

## 2021-10-17 DIAGNOSIS — R519 Headache, unspecified: Secondary | ICD-10-CM

## 2021-10-17 DIAGNOSIS — Z3491 Encounter for supervision of normal pregnancy, unspecified, first trimester: Secondary | ICD-10-CM

## 2021-10-17 DIAGNOSIS — O26891 Other specified pregnancy related conditions, first trimester: Secondary | ICD-10-CM

## 2021-10-17 DIAGNOSIS — R1030 Lower abdominal pain, unspecified: Secondary | ICD-10-CM | POA: Insufficient documentation

## 2021-10-17 DIAGNOSIS — Z3A09 9 weeks gestation of pregnancy: Secondary | ICD-10-CM

## 2021-10-17 DIAGNOSIS — O99891 Other specified diseases and conditions complicating pregnancy: Secondary | ICD-10-CM | POA: Insufficient documentation

## 2021-10-17 LAB — WET PREP, GENITAL
Clue Cells Wet Prep HPF POC: NONE SEEN
Sperm: NONE SEEN
Trich, Wet Prep: NONE SEEN
Yeast Wet Prep HPF POC: NONE SEEN

## 2021-10-17 LAB — URINALYSIS, ROUTINE W REFLEX MICROSCOPIC
Bilirubin Urine: NEGATIVE
Glucose, UA: NEGATIVE mg/dL
Hgb urine dipstick: NEGATIVE
Ketones, ur: NEGATIVE mg/dL
Leukocytes,Ua: NEGATIVE
Nitrite: NEGATIVE
Protein, ur: NEGATIVE mg/dL
Specific Gravity, Urine: 1.015 (ref 1.005–1.030)
pH: 6 (ref 5.0–8.0)

## 2021-10-17 LAB — CBC
HCT: 37.1 % (ref 36.0–46.0)
Hemoglobin: 12.7 g/dL (ref 12.0–15.0)
MCH: 30 pg (ref 26.0–34.0)
MCHC: 34.2 g/dL (ref 30.0–36.0)
MCV: 87.5 fL (ref 80.0–100.0)
Platelets: 307 10*3/uL (ref 150–400)
RBC: 4.24 MIL/uL (ref 3.87–5.11)
RDW: 12.2 % (ref 11.5–15.5)
WBC: 6 10*3/uL (ref 4.0–10.5)
nRBC: 0 % (ref 0.0–0.2)

## 2021-10-17 LAB — POCT PREGNANCY, URINE: Preg Test, Ur: POSITIVE — AB

## 2021-10-17 LAB — HCG, QUANTITATIVE, PREGNANCY: hCG, Beta Chain, Quant, S: 399795 m[IU]/mL — ABNORMAL HIGH (ref <5)

## 2021-10-17 MED ORDER — ACETAMINOPHEN 500 MG PO TABS: 500.0000 mg | ORAL_TABLET | Freq: Four times a day (QID) | ORAL | 0 refills | Status: DC | PRN

## 2021-10-17 MED ORDER — METOCLOPRAMIDE HCL 10 MG PO TABS: 10.0000 mg | ORAL_TABLET | Freq: Four times a day (QID) | ORAL | 0 refills | Status: DC

## 2021-10-17 MED ORDER — METOCLOPRAMIDE HCL 10 MG PO TABS
10.0000 mg | ORAL_TABLET | Freq: Once | ORAL | Status: AC
  Administered 2021-10-17: 10 mg via ORAL
  Filled 2021-10-17: qty 1

## 2021-10-17 MED ORDER — ACETAMINOPHEN 500 MG PO TABS
1000.0000 mg | ORAL_TABLET | Freq: Once | ORAL | Status: AC
  Administered 2021-10-17: 1000 mg via ORAL
  Filled 2021-10-17: qty 2

## 2021-10-17 NOTE — MAU Note (Signed)
Presents with c/o H/A since onset of pregnancy.  Reports H/A has been worse for past 2 days.  States taking Fioricet, relives H/A temporarily but H/A returns.  Also reports lower abdominal and back pain that began this morning.  Denies VB.  LMP 08/12/2021

## 2021-10-17 NOTE — Discharge Instructions (Signed)
Prenatal Care Providers           [facility]  [address] [phone]  [facility]   [address]  [phone]  [facility] [place] [facility]       [address] [phone]            [facility] [place]     [address] [phone]          [facility] [place]   [address] [phone]  [facility]  [address] [phone]     [facility])  [address]   [phone]     [facility]  Phone: [phone]  [facility]  Phone: [phone]  [facility] Phone: [phone]  [facility] Phone: [phone]  [facility] Phone: [phone]  [facility] Phone: [phone]  [facility]  Phone: [phone] Safe Medications in Pregnancy   Acne: Benzoyl Peroxide Salicylic Acid  Backache/Headache: Tylenol: 2 regular strength every 4 hours OR              2 Extra strength every 6 hours  Colds/Coughs/Allergies: Benadryl (alcohol free) 25 mg every 6 hours as needed Breath right strips Claritin Cepacol throat lozenges Chloraseptic throat spray Cold-Eeze- up to three times per day Cough drops, alcohol free Flonase (by prescription only) Guaifenesin Mucinex Robitussin DM (plain only, alcohol free) Saline nasal spray/drops Sudafed (pseudoephedrine) & Actifed ** use only after [redacted] weeks gestation and if you do not have high blood pressure Tylenol Vicks Vaporub Zinc lozenges Zyrtec   Constipation: Colace Ducolax suppositories Fleet enema Glycerin suppositories Metamucil Milk of magnesia Miralax Senokot Smooth move tea  Diarrhea: Kaopectate Imodium A-D  *NO pepto  Bismol  Hemorrhoids: Anusol Anusol HC Preparation H Tucks  Indigestion: Tums Maalox Mylanta Zantac  Pepcid  Insomnia: Benadryl (alcohol free) 25mg every 6 hours as needed Tylenol PM Unisom, no Gelcaps  Leg Cramps: Tums MagGel  Nausea/Vomiting:  Bonine Dramamine Emetrol Ginger extract Sea bands Meclizine  Nausea medication to take during pregnancy:  Unisom (doxylamine succinate 25 mg tablets) Take one tablet daily at bedtime. If symptoms are not adequately controlled, the dose can be increased to a maximum recommended dose of two tablets daily (1/2 tablet in the morning, 1/2 tablet mid-afternoon and one at bedtime). Vitamin B6 100mg tablets. Take one tablet twice a day (up to 200 mg per day).  Skin Rashes: Aveeno products Benadryl cream or 25mg every 6 hours as needed Calamine Lotion 1% cortisone cream  Yeast infection: Gyne-lotrimin 7 Monistat 7   **If taking multiple medications, please check labels to avoid duplicating the same active ingredients **take medication as directed on the label ** Do not exceed 4000 mg of tylenol in 24 hours **Do not take medications that contain aspirin or ibuprofen    

## 2021-10-17 NOTE — MAU Provider Note (Signed)
History     CSN: [id number]  Arrival date and time: 10/17/21 1608   Event Date/Time   First Provider Initiated Contact with Patient 10/17/21 1654      Chief Complaint  Patient presents with   Headache   Abdominal Pain   Back Pain   HPI Angela Russo is a 30 y.o. G4P1021 at [redacted]w[redacted]d who presents with a headache and abdominal pain. She reports she has had a headache since she found out she was pregnant. She rates the headache a 7/10 and has tried fioricet. She reports the medication helps for a while but then the headache comes back. She reports lower abdominal cramping that she rates a 4/10 and has not tried anything for. She denies any bleeding or discharge. She reports her legs feel heavy in the morning when she wakes up.   OB History     Gravida  4   Para  1   Term  1   Preterm      AB  2   Living  1      SAB  2   IAB  0   Ectopic      Multiple  0   Live Births  1           Past Medical History:  Diagnosis Date   Medical history non-contributory     Past Surgical History:  Procedure Laterality Date   APPENDECTOMY      No family history on file.  Social History   Tobacco Use   Smoking status: Never   Smokeless tobacco: Never  Vaping Use   Vaping Use: Never used  Substance Use Topics   Alcohol use: Never   Drug use: Never    Allergies: No Known Allergies  Medications Prior to Admission  Medication Sig Dispense Refill Last Dose   erythromycin ophthalmic ointment Place a 1/2 inch ribbon of ointment into the lower eyelid 4 times daily x 1 week 3.5 g 0    tobramycin-dexamethasone (TOBRADEX) ophthalmic solution Place 1 drop into the right eye every 4 (four) hours while awake. 5 mL 0     Review of Systems  Constitutional: Negative.  Negative for fatigue and fever.  HENT: Negative.    Respiratory: Negative.  Negative for shortness of breath.   Cardiovascular: Negative.  Negative for chest pain.  Gastrointestinal:  Positive for abdominal  pain. Negative for constipation, diarrhea, nausea and vomiting.  Genitourinary: Negative.  Negative for dysuria, vaginal bleeding and vaginal discharge.  Neurological:  Positive for headaches. Negative for dizziness.  Physical Exam   Blood pressure 106/65, pulse (!) 108, temperature 98.1 F (36.7 C), temperature source Oral, resp. rate 20, height 5\' 2"  (1.575 m), weight 49.5 kg, last menstrual period 08/12/2021, SpO2 99 %, unknown if currently breastfeeding.  Physical Exam Vitals and nursing note reviewed.  Constitutional:      General: She is not in acute distress.    Appearance: She is well-developed.  HENT:     Head: Normocephalic.  Eyes:     Pupils: Pupils are equal, round, and reactive to light.  Cardiovascular:     Rate and Rhythm: Normal rate and regular rhythm.     Heart sounds: Normal heart sounds.  Pulmonary:     Effort: Pulmonary effort is normal. No respiratory distress.     Breath sounds: Normal breath sounds.  Abdominal:     General: Bowel sounds are normal. There is no distension.     Palpations: Abdomen is soft.  Tenderness: There is no abdominal tenderness.  Skin:    General: Skin is warm and dry.  Neurological:     Mental Status: She is alert and oriented to person, place, and time.  Psychiatric:        Mood and Affect: Mood normal.        Behavior: Behavior normal.        Thought Content: Thought content normal.        Judgment: Judgment normal.    MAU Course  Procedures Results for orders placed or performed during the hospital encounter of 10/17/21 (from the past 24 hour(s))  Pregnancy, urine POC     Status: Abnormal   Collection Time: 10/17/21  4:33 PM  Result Value Ref Range   Preg Test, Ur POSITIVE (A) NEGATIVE  Urinalysis, Routine w reflex microscopic Urine, Clean Catch     Status: Abnormal   Collection Time: 10/17/21  4:40 PM  Result Value Ref Range   Color, Urine YELLOW YELLOW   APPearance HAZY (A) CLEAR   Specific Gravity, Urine 1.015  1.005 - 1.030   pH 6.0 5.0 - 8.0   Glucose, UA NEGATIVE NEGATIVE mg/dL   Hgb urine dipstick NEGATIVE NEGATIVE   Bilirubin Urine NEGATIVE NEGATIVE   Ketones, ur NEGATIVE NEGATIVE mg/dL   Protein, ur NEGATIVE NEGATIVE mg/dL   Nitrite NEGATIVE NEGATIVE   Leukocytes,Ua NEGATIVE NEGATIVE  Wet prep, genital     Status: Abnormal   Collection Time: 10/17/21  4:40 PM   Specimen: Vaginal  Result Value Ref Range   Yeast Wet Prep HPF POC NONE SEEN NONE SEEN   Trich, Wet Prep NONE SEEN NONE SEEN   Clue Cells Wet Prep HPF POC NONE SEEN NONE SEEN   WBC, Wet Prep HPF POC MANY (A) NONE SEEN   Sperm NONE SEEN   CBC     Status: None   Collection Time: 10/17/21  4:48 PM  Result Value Ref Range   WBC 6.0 4.0 - 10.5 K/uL   RBC 4.24 3.87 - 5.11 MIL/uL   Hemoglobin 12.7 12.0 - 15.0 g/dL   HCT 17.9 15.0 - 56.9 %   MCV 87.5 80.0 - 100.0 fL   MCH 30.0 26.0 - 34.0 pg   MCHC 34.2 30.0 - 36.0 g/dL   RDW 79.4 80.1 - 65.5 %   Platelets 307 150 - 400 K/uL   nRBC 0.0 0.0 - 0.2 %    US OB Comp Less 14 Wks  Result Date: 10/17/2021 CLINICAL DATA:  Vaginal bleeding. EXAM: OBSTETRIC <14 WK ULTRASOUND TECHNIQUE: Transabdominal ultrasound was performed for evaluation of the gestation as well as the maternal uterus and adnexal regions. COMPARISON:  None. FINDINGS: Intrauterine gestational sac: Single Yolk sac:  Visualized. Embryo:  Visualized. Cardiac Activity: Visualized. Heart Rate: 162 bpm MSD:    mm    w     d CRL:   29.3 mm   9 w 5 d                  [place] EDC: May 17, 2022 Subchorionic hemorrhage:  None visualized. Maternal uterus/adnexae: IMPRESSION: Single live IUP.  No cause for bleeding identified. Electronically Signed   By: Gerome Sam III M.D.   On: 10/17/2021 17:59     MDM UA, UPT CBC, HCG ABO/Rh- B Pos Wet prep and gc/chlamydia US OB Comp Less 14 weeks with Transvaginal  Tylenol and Reglan PO- patient reports resolution of HA Discussed that headaches are likely rebound from fioricet and  recommended  discontinuation   Assessment and Plan   1. Normal intrauterine pregnancy on prenatal ultrasound in first trimester   2. Abdominal pain affecting pregnancy   3. Pregnancy headache in first trimester   4. [redacted] weeks gestation of pregnancy    -Discharge home in stable condition -RX for tylenol and reglan sent to pharmacy -First trimester precautions discussed -Patient advised to follow-up with OB as scheduled for prenatal care -Patient may return to MAU as needed or if her condition were to change or worsen   Rolm Bookbinder CNM 10/17/2021, 4:54 PM

## 2021-10-18 LAB — GC/CHLAMYDIA PROBE AMP (~~LOC~~) NOT AT ARMC
Chlamydia: NEGATIVE
Comment: NEGATIVE
Comment: NORMAL
Neisseria Gonorrhea: NEGATIVE

## 2021-11-22 ENCOUNTER — Encounter (HOSPITAL_COMMUNITY): Payer: Self-pay | Admitting: Obstetrics & Gynecology

## 2021-11-22 ENCOUNTER — Inpatient Hospital Stay (HOSPITAL_COMMUNITY)
Admission: AD | Admit: 2021-11-22 | Discharge: 2021-11-22 | Disposition: A | Discharge status: Home / Self Care | Payer: Medicaid Other | Attending: Obstetrics & Gynecology | Admitting: Obstetrics & Gynecology

## 2021-11-22 ENCOUNTER — Other Ambulatory Visit: Payer: Self-pay

## 2021-11-22 DIAGNOSIS — O2690 Pregnancy related conditions, unspecified, unspecified trimester: Secondary | ICD-10-CM | POA: Diagnosis not present

## 2021-11-22 DIAGNOSIS — O26892 Other specified pregnancy related conditions, second trimester: Secondary | ICD-10-CM | POA: Insufficient documentation

## 2021-11-22 DIAGNOSIS — R109 Unspecified abdominal pain: Secondary | ICD-10-CM | POA: Diagnosis not present

## 2021-11-22 DIAGNOSIS — Z3490 Encounter for supervision of normal pregnancy, unspecified, unspecified trimester: Secondary | ICD-10-CM

## 2021-11-22 DIAGNOSIS — O26852 Spotting complicating pregnancy, second trimester: Secondary | ICD-10-CM | POA: Insufficient documentation

## 2021-11-22 DIAGNOSIS — Z3A14 14 weeks gestation of pregnancy: Secondary | ICD-10-CM | POA: Diagnosis not present

## 2021-11-22 DIAGNOSIS — M545 Low back pain, unspecified: Secondary | ICD-10-CM | POA: Insufficient documentation

## 2021-11-22 DIAGNOSIS — R102 Pelvic and perineal pain: Secondary | ICD-10-CM | POA: Diagnosis not present

## 2021-11-22 HISTORY — DX: Gestational (pregnancy-induced) hypertension without significant proteinuria, unspecified trimester: O13.9

## 2021-11-22 HISTORY — DX: Headache, unspecified: R51.9

## 2021-11-22 LAB — URINALYSIS, ROUTINE W REFLEX MICROSCOPIC
Bilirubin Urine: NEGATIVE
Glucose, UA: NEGATIVE mg/dL
Hgb urine dipstick: NEGATIVE
Ketones, ur: NEGATIVE mg/dL
Leukocytes,Ua: NEGATIVE
Nitrite: NEGATIVE
Protein, ur: NEGATIVE mg/dL
Specific Gravity, Urine: <1.005 — ABNORMAL LOW (ref 1.005–1.030)
pH: 7 (ref 5.0–8.0)

## 2021-11-22 MED ORDER — CYCLOBENZAPRINE HCL 10 MG PO TABS
10.0000 mg | ORAL_TABLET | Freq: Three times a day (TID) | ORAL | 0 refills | Status: DC | PRN
Start: 2021-11-22 — End: 2022-03-06

## 2021-11-22 NOTE — MAU Note (Signed)
Angela Russo is a 30 y.o. at [redacted]w[redacted]d here in MAU reporting: started with intermittent lower abdominal and back pain since this morning. About 1 hour went to the bathroom and saw 1 spot of blood. No LOF.  Onset of complaint: today  Pain score: 5/10  Vitals:   11/22/21 1807  BP: 105/60  Pulse: (!) 104  Resp: 16  Temp: 98 F (36.7 C)  SpO2: 100%     FHT:156  Lab orders placed from triage: UA

## 2021-11-22 NOTE — Progress Notes (Signed)
Written and verbal d/c instructions given and understanding voiced. 

## 2021-11-22 NOTE — MAU Note (Signed)
Pt had picture of the spot, very small ,  another time mucous with pinkish yellow tint/streak.  Cramping in lower back and abd.  Denies recent intercourse.

## 2021-11-22 NOTE — MAU Provider Note (Addendum)
Patient Angela Russo is a 30 y.o. G4P1021  At [redacted]w[redacted]d here with complaints of vaginal spotting one time this evening at 5 pm and on/off again cramps since this morning. She denies fever, SOB, chills, contractions, LOF. She denies dysuria, abnormal discharge. She has a history of two miscarriages and one NSVD 2020.  She states that her two miscarriages started like this. she is worried so she wanted to get checked out.  She has a history of pre-eclampsia and was treated with mgso4 in her last delivery.  She denies any difficulty going to the bathroom; any other complaints. She is taking baby  ASA.   She gets her care at [facility]. She reports that she is taking 1000 mg of tylenol q 6 hours for the past month for her headaches. She has no headache now.     History     CSN: [id number]  Arrival date and time: 11/22/21 1747   Event Date/Time   First Provider Initiated Contact with Patient 11/22/21 1918      Chief Complaint  Patient presents with   Abdominal Pain   Back Pain   Vaginal Bleeding   Abdominal Pain This is a new problem. The problem has been unchanged. The pain is located in the suprapubic region. The pain is at a severity of 5/10. Associated symptoms include nausea. Pertinent negatives include no constipation, diarrhea, dysuria, fever or vomiting.  Back Pain This is a new problem. The current episode started today. The quality of the pain is described as aching. The pain is at a severity of 5/10. Associated symptoms include abdominal pain. Pertinent negatives include no dysuria or fever.  Vaginal Bleeding The patient's primary symptoms include vaginal bleeding. This is a new problem. The current episode started today. The problem occurs rarely. The problem has been resolved. Associated symptoms include abdominal pain, back pain and nausea. Pertinent negatives include no constipation, diarrhea, dysuria, fever or vomiting. The vaginal discharge was bloody. The vaginal bleeding is  spotting.   OB History     Gravida  4   Para  1   Term  1   Preterm      AB  2   Living  1      SAB  2   IAB  0   Ectopic      Multiple  0   Live Births  1        Obstetric Comments  Pre-e  2020         Past Medical History:  Diagnosis Date   Headache    Pregnancy induced hypertension     Past Surgical History:  Procedure Laterality Date   APPENDECTOMY      Family History  Problem Relation Age of Onset   Cancer Mother        leukemia   Diabetes Father     Social History   Tobacco Use   Smoking status: Never   Smokeless tobacco: Never  Vaping Use   Vaping Use: Never used  Substance Use Topics   Alcohol use: Never   Drug use: Never    Allergies: No Known Allergies  Medications Prior to Admission  Medication Sig Dispense Refill Last Dose   acetaminophen (TYLENOL) 500 MG tablet Take 1 tablet (500 mg total) by mouth every 6 (six) hours as needed. (Patient taking differently: Take 1,000 mg by mouth every 6 (six) hours as needed.) 30 tablet 0 11/22/2021 at 1600   aspirin EC 81 MG tablet Take 81 mg  by mouth daily. Swallow whole.   11/21/2021   metoCLOPramide (REGLAN) 10 MG tablet Take 1 tablet (10 mg total) by mouth every 6 (six) hours. 30 tablet 0 11/22/2021 at 1600   Prenatal Vit-Fe Fumarate-FA (MULTIVITAMIN-PRENATAL) 27-0.8 MG TABS tablet Take 1 tablet by mouth daily at 12 noon.   11/22/2021 at t    Review of Systems  Constitutional: Negative.  Negative for fever.  HENT: Negative.    Eyes: Negative.   Respiratory: Negative.    Gastrointestinal:  Positive for abdominal pain and nausea. Negative for constipation, diarrhea and vomiting.  Genitourinary:  Positive for vaginal bleeding. Negative for dysuria.  Musculoskeletal:  Positive for back pain.  Neurological: Negative.   Hematological: Negative.   Psychiatric/Behavioral: Negative.    Physical Exam   Blood pressure 105/60, pulse (!) 104, temperature 98 F (36.7 C), temperature  source Oral, resp. rate 16, height 5\' 2"  (1.575 m), weight 50.6 kg, last menstrual period 08/12/2021, SpO2 100 %, unknown if currently breastfeeding.  Physical Exam Constitutional:      Appearance: She is well-developed.  Abdominal:     General: Abdomen is flat. There is no distension.     Palpations: Abdomen is soft.     Tenderness: There is no abdominal tenderness.  Genitourinary:    Vagina: Normal. No vaginal discharge, tenderness or bleeding.     Cervix: Normal.  Neurological:     Mental Status: She is alert.  NEFG; no blood in the vagina, trace milky white discharge in the vagina, cervix is long, closed, no CMT.   MAU Course  Procedures  MDM -patient declines pelvic/vaginal cultures -patient had no bleeding while in MAU; spec exam is benign -blood type is B pos Assessment and Plan   1. Intrauterine pregnancy   2. Acute bilateral low back pain without sciatica    2. Patient will try flexeril at home 3. Reviewed warning signs and when to come to MAU; patient knows the warning signs of miscarriage (vaginal bleeding, LOF, increased pain and cramping) and she will return to MAU  -Keep appt on 12/07/2021.  -work note given for patient's husband to be at home tomorrow in case patient needs to return to hospital -reviewed that she is taking too much tylenol and that she needs to reduce as well as try flexeril.   12/09/2021 Shlomie Romig 11/22/2021, 7:28 PM

## 2021-12-12 NOTE — L&D Delivery Note (Signed)
Vaginal Delivery Note  Labor Event Times and significant notes: Admitted: 05/12/22 at [place] for proteinuria Admission exam 2-3/70/-3 07:30 Pitocin 2 x 47m was initiated for induction / augmentation 09:58 Epidural V_0 :20 RN called re bright red vaginal bleeding --> exam by Met 10:25  10:25 AROM, bloody foley placed Position LOA  Dilation complete :12:27 PM Pushing initiated at 12:38 ----------------------------------------------------------------------------------------------------------- Patient pushed for @ 6 minutes after she was noted to be C/C/+2. Guided pushing with maternal urge and regular contractions. At 12:44 PM a viable and healthy female was delivered via Vaginal, Spontaneous (Presentation: Left Occiput Anterior).  APGAR: 9, 9; weight 6 lb 4 oz (2835 g).   After head was delivered shoulders and body easily delivered.  Baby laid on maternal abdomen, dried and tactile stimulation.  Baby noted to have a vigorous cry and moving all four extremities. Delayed cord clamping done and cord cut by father. Cord blood obtained.  (Venous cord gas sample collected) Placenta spontaneously delivered intact with trailing membranes,   Uterine atony alleviated by IV pitocin and massage.  Upon inspection of the cervix, vagina and perineum there was a Second degree laceration repaired in routine fashion with 2-0 vicryl and 3-0 chromic  Patient had episode of marked nausea and vomiting immediately after delivery; remained AAOx3 and hemodynamically stable. Resolved with IV Zofran.  Ultimately patient tolerated delivery well, there were no complications.    Delivery Details: Birth information: Date of birth:   May 12, 2024  Time of birth:   12:44 PM  Sex:    Female   Name:    "Dan"  APGAR APGAR (1 MIN): 9   APGAR (5 MINS): 9    Weight    2835 grams 6# 4 oz   Resuscitation:   Drying, stimulation, bulb suction  *pulse ox done for subtle   Cord information: 3 vessel cord   Complications:      Suspected abruption NO hemorrhage  Placenta: Delivered: Spontaneous, intact with a 3 Vessel Cord: 3 Large clot present posteriorly suspect abruptio -> sent to Pathology  Disposition: Mom to postpartum.  Baby to Couplet care / Skin to Skin.  WSanjuana KavaMD 05/13/2022, 3:53 PM

## 2022-01-03 ENCOUNTER — Inpatient Hospital Stay (HOSPITAL_COMMUNITY)
Admission: AD | Admit: 2022-01-03 | Discharge: 2022-01-03 | Disposition: A | Discharge status: Home / Self Care | Payer: Medicaid Other | Attending: Obstetrics and Gynecology | Admitting: Obstetrics and Gynecology

## 2022-01-03 ENCOUNTER — Inpatient Hospital Stay (HOSPITAL_COMMUNITY): Payer: Medicaid Other

## 2022-01-03 ENCOUNTER — Other Ambulatory Visit: Payer: Self-pay

## 2022-01-03 ENCOUNTER — Encounter (HOSPITAL_COMMUNITY): Payer: Self-pay | Admitting: Obstetrics and Gynecology

## 2022-01-03 DIAGNOSIS — O26892 Other specified pregnancy related conditions, second trimester: Secondary | ICD-10-CM | POA: Diagnosis present

## 2022-01-03 DIAGNOSIS — O99612 Diseases of the digestive system complicating pregnancy, second trimester: Secondary | ICD-10-CM | POA: Diagnosis not present

## 2022-01-03 DIAGNOSIS — K8020 Calculus of gallbladder without cholecystitis without obstruction: Secondary | ICD-10-CM | POA: Diagnosis not present

## 2022-01-03 DIAGNOSIS — O26612 Liver and biliary tract disorders in pregnancy, second trimester: Secondary | ICD-10-CM | POA: Diagnosis not present

## 2022-01-03 DIAGNOSIS — Z3A20 20 weeks gestation of pregnancy: Secondary | ICD-10-CM | POA: Insufficient documentation

## 2022-01-03 DIAGNOSIS — R109 Unspecified abdominal pain: Secondary | ICD-10-CM

## 2022-01-03 LAB — COMPREHENSIVE METABOLIC PANEL
ALT: 13 U/L (ref 0–44)
AST: 20 U/L (ref 15–41)
Albumin: 3.2 g/dL — ABNORMAL LOW (ref 3.5–5.0)
Alkaline Phosphatase: 63 U/L (ref 38–126)
Anion gap: 8 (ref 5–15)
BUN: 5 mg/dL — ABNORMAL LOW (ref 6–20)
CO2: 23 mmol/L (ref 22–32)
Calcium: 8.5 mg/dL — ABNORMAL LOW (ref 8.9–10.3)
Chloride: 105 mmol/L (ref 98–111)
Creatinine, Ser: 0.4 mg/dL — ABNORMAL LOW (ref 0.44–1.00)
GFR, Estimated: >60 mL/min (ref >60)
Glucose, Bld: 78 mg/dL (ref 70–99)
Potassium: 3.4 mmol/L — ABNORMAL LOW (ref 3.5–5.1)
Sodium: 136 mmol/L (ref 135–145)
Total Bilirubin: 0.3 mg/dL (ref 0.3–1.2)
Total Protein: 6.2 g/dL — ABNORMAL LOW (ref 6.5–8.1)

## 2022-01-03 LAB — CBC
HCT: 34.3 % — ABNORMAL LOW (ref 36.0–46.0)
Hemoglobin: 11.6 g/dL — ABNORMAL LOW (ref 12.0–15.0)
MCH: 30.9 pg (ref 26.0–34.0)
MCHC: 33.8 g/dL (ref 30.0–36.0)
MCV: 91.5 fL (ref 80.0–100.0)
Platelets: 260 10*3/uL (ref 150–400)
RBC: 3.75 MIL/uL — ABNORMAL LOW (ref 3.87–5.11)
RDW: 13.4 % (ref 11.5–15.5)
WBC: 8.1 10*3/uL (ref 4.0–10.5)
nRBC: 0 % (ref 0.0–0.2)

## 2022-01-03 LAB — URINALYSIS, ROUTINE W REFLEX MICROSCOPIC
Bilirubin Urine: NEGATIVE
Glucose, UA: NEGATIVE mg/dL
Hgb urine dipstick: NEGATIVE
Ketones, ur: NEGATIVE mg/dL
Leukocytes,Ua: NEGATIVE
Nitrite: NEGATIVE
Protein, ur: NEGATIVE mg/dL
Specific Gravity, Urine: 1.011 (ref 1.005–1.030)
pH: 6 (ref 5.0–8.0)

## 2022-01-03 LAB — TYPE AND SCREEN
ABO/RH(D): B POS
Antibody Screen: NEGATIVE

## 2022-01-03 LAB — LIPASE, BLOOD: Lipase: 33 U/L (ref 11–51)

## 2022-01-03 LAB — AMYLASE: Amylase: 113 U/L — ABNORMAL HIGH (ref 28–100)

## 2022-01-03 MED ORDER — OXYCODONE-ACETAMINOPHEN 5-325 MG PO TABS: 1.0000 | ORAL_TABLET | Freq: Four times a day (QID) | ORAL | 0 refills | Status: DC | PRN

## 2022-01-03 MED ORDER — ONDANSETRON 8 MG PO TBDP
8.0000 mg | ORAL_TABLET | Freq: Three times a day (TID) | ORAL | 0 refills | Status: DC | PRN
Start: 2022-01-03 — End: 2022-05-13

## 2022-01-03 MED ORDER — DIPHENHYDRAMINE HCL 50 MG/ML IJ SOLN
12.5000 mg | Freq: Once | INTRAMUSCULAR | Status: AC
Start: 2022-01-03 — End: 2022-01-03
  Administered 2022-01-03: 12.5 mg via INTRAVENOUS
  Filled 2022-01-03: qty 1

## 2022-01-03 MED ORDER — ONDANSETRON 4 MG PO TBDP
8.0000 mg | ORAL_TABLET | Freq: Once | ORAL | Status: AC
  Administered 2022-01-03: 8 mg via ORAL
  Filled 2022-01-03: qty 2

## 2022-01-03 MED ORDER — METOCLOPRAMIDE HCL 10 MG PO TABS
10.0000 mg | ORAL_TABLET | Freq: Four times a day (QID) | ORAL | 0 refills | Status: DC
Start: 2022-01-03 — End: 2022-05-13

## 2022-01-03 MED ORDER — LACTATED RINGERS IV SOLN: INTRAVENOUS | Status: DC

## 2022-01-03 MED ORDER — METOCLOPRAMIDE HCL 5 MG/ML IJ SOLN
10.0000 mg | Freq: Once | INTRAMUSCULAR | Status: AC
Start: 2022-01-03 — End: 2022-01-03
  Administered 2022-01-03: 10 mg via INTRAVENOUS
  Filled 2022-01-03: qty 2

## 2022-01-03 NOTE — MAU Note (Signed)
Having upper abd pain, started yesterday. Has a HA "from the start of her pregnancy".  Last 2 days, has started vomiting. Did not eat today, was able to keep a cup of tea down today. Has not thrown up today.

## 2022-01-03 NOTE — Discharge Instructions (Addendum)
-  liquid diet for the next 24 hours -when resume eating, eat crackers, rice, no fatty foods,  -call [facility] in the morning to set up consult [phone]

## 2022-01-03 NOTE — MAU Provider Note (Addendum)
Patient Angela Russo is a 31 y.o. [phone]  At [redacted]w[redacted]d here with complaints of vomiting and nausea, abdominal pain and chronic HA  She started vomiting over the weekend and  having abdominal pain over the weekend. She denies vaginal bleeding, dysuria, diarrhea or constipation. She denies contractions, LOF.   She was sent from [facility] by her provider after reporting HA and RUQ pain this morning.  History     CSN: [id number]  Arrival date and time: 01/03/22 1304   None     Chief Complaint  Patient presents with   Abdominal Pain   Headache   Nausea   Abdominal Pain This is a new problem. The current episode started in the past 7 days. The onset quality is sudden. The problem occurs constantly. The problem has been unchanged. The pain is located in the RUQ. The pain is at a severity of 6/10. Pain radiation: sometimes radiates to back. Associated symptoms include headaches, nausea and vomiting. Pertinent negatives include no constipation, diarrhea or dysuria. Nothing aggravates the pain. The pain is relieved by Nothing.  Headache  This is a chronic problem. The current episode started 1 to 4 weeks ago. The quality of the pain is described as aching. The pain is at a severity of 10/10. Associated symptoms include abdominal pain, nausea and vomiting.  Patient has been taking ibuprofen prescribed by her doctor daily. Patient states that her "doctor knows" that she is taking daily IBUprofen every 6 hours.  OB History     Gravida  4   Para  1   Term  1   Preterm      AB  2   Living  1      SAB  2   IAB  0   Ectopic      Multiple  0   Live Births  1        Obstetric Comments  Pre-e  2020         Past Medical History:  Diagnosis Date   Headache    Pregnancy induced hypertension     Past Surgical History:  Procedure Laterality Date   APPENDECTOMY      Family History  Problem Relation Age of Onset   Cancer Mother        leukemia   Diabetes Father     Social  History   Tobacco Use   Smoking status: Never   Smokeless tobacco: Never  Vaping Use   Vaping Use: Never used  Substance Use Topics   Alcohol use: Never   Drug use: Never    Allergies: No Known Allergies  Medications Prior to Admission  Medication Sig Dispense Refill Last Dose   aspirin EC 81 MG tablet Take 81 mg by mouth daily. Swallow whole.   01/03/2022   ibuprofen (ADVIL) 600 MG tablet Take 600 mg by mouth every 6 (six) hours as needed.   01/03/2022 at 0600   metoCLOPramide (REGLAN) 10 MG tablet Take 1 tablet (10 mg total) by mouth every 6 (six) hours. 30 tablet 0 01/03/2022 at 0900   Prenatal Vit-Fe Fumarate-FA (MULTIVITAMIN-PRENATAL) 27-0.8 MG TABS tablet Take 1 tablet by mouth daily at 12 noon.   01/03/2022   acetaminophen (TYLENOL) 500 MG tablet Take 1 tablet (500 mg total) by mouth every 6 (six) hours as needed. (Patient taking differently: Take 1,000 mg by mouth every 6 (six) hours as needed.) 30 tablet 0    cyclobenzaprine (FLEXERIL) 10 MG tablet Take 1 tablet (10 mg total)  by mouth 3 (three) times daily as needed for muscle spasms. 30 tablet 0     Review of Systems  Constitutional: Negative.   HENT: Negative.    Respiratory: Negative.    Cardiovascular: Negative.   Gastrointestinal:  Positive for abdominal pain, nausea and vomiting. Negative for constipation and diarrhea.  Genitourinary:  Negative for dysuria.  Neurological:  Positive for headaches.  Psychiatric/Behavioral: Negative.    Physical Exam   Blood pressure 104/64, pulse 97, temperature 98.3 F (36.8 C), temperature source Oral, resp. rate 16, weight 53.8 kg, last menstrual period 08/12/2021, SpO2 100 %, unknown if currently breastfeeding.  Physical Exam Constitutional:      Appearance: She is well-developed. She is not ill-appearing or diaphoretic.  Pulmonary:     Effort: Pulmonary effort is normal.  Abdominal:     General: Abdomen is flat.     Palpations: Abdomen is soft.     Tenderness: There is  abdominal tenderness in the right upper quadrant. Positive signs include Murphy's sign.  Skin:    General: Skin is warm and dry.  Neurological:     General: No focal deficit present.     Mental Status: She is alert.  Psychiatric:        Mood and Affect: Mood normal.    MAU Course  Procedures  MDM -patient had fluids, antiemetics and HA cocktail while in MAU -FHT present by Doppler -US shows 1.5 cm gallstone but no gall bladder wall thickening, non obstructing -patient has normal lipase, slightly elevated amylase, UA normal  Discussed with Dr. Adrian Blackwater, who recommends TC to general surgery. TC to Dr. Sheliah Hatch, who recommends that given non-obstructing stone and no gall bladder wall thickening, patient can call [facility] in the morning.   2100: patient has rested, denies HA or abdominal pain at this time.  Assessment and Plan   1. Cholelithiasis affecting pregnancy in second trimester, antepartum   2. Abdominal pain   -RX for percocet given for the next two days, explained how to take -Phone number for [facility] given with instructions to call in the morning.  -discussed only liquids for the next 24 hours and then begin small light meals with nausea medicine -talk to OB about headaches; could consider referral to neurology given that patient has persistent HA -strict return precautions given-worsening pain, unable to tolerate liquids, fever, all over body chills; all questions answered.   Charlesetta Garibaldi Kellis Mcadam 01/03/2022, 7:10 PM

## 2022-01-04 LAB — RPR: RPR Ser Ql: NONREACTIVE

## 2022-01-05 ENCOUNTER — Inpatient Hospital Stay (HOSPITAL_COMMUNITY)
Admission: AD | Admit: 2022-01-05 | Discharge: 2022-01-07 | DRG: 817 | Disposition: A | Discharge status: Home / Self Care | Payer: Medicaid Other | Attending: Obstetrics & Gynecology | Admitting: Obstetrics & Gynecology

## 2022-01-05 ENCOUNTER — Inpatient Hospital Stay (HOSPITAL_COMMUNITY): Payer: Medicaid Other

## 2022-01-05 ENCOUNTER — Other Ambulatory Visit: Payer: Self-pay

## 2022-01-05 ENCOUNTER — Encounter (HOSPITAL_COMMUNITY): Payer: Self-pay | Admitting: Obstetrics & Gynecology

## 2022-01-05 DIAGNOSIS — Z7982 Long term (current) use of aspirin: Secondary | ICD-10-CM | POA: Diagnosis not present

## 2022-01-05 DIAGNOSIS — K8020 Calculus of gallbladder without cholecystitis without obstruction: Secondary | ICD-10-CM | POA: Diagnosis present

## 2022-01-05 DIAGNOSIS — O3482 Maternal care for other abnormalities of pelvic organs, second trimester: Secondary | ICD-10-CM | POA: Diagnosis present

## 2022-01-05 DIAGNOSIS — Z3A20 20 weeks gestation of pregnancy: Secondary | ICD-10-CM

## 2022-01-05 DIAGNOSIS — N90810 Female genital mutilation status, unspecified: Secondary | ICD-10-CM | POA: Diagnosis present

## 2022-01-05 DIAGNOSIS — Z20822 Contact with and (suspected) exposure to covid-19: Secondary | ICD-10-CM | POA: Diagnosis present

## 2022-01-05 DIAGNOSIS — K8000 Calculus of gallbladder with acute cholecystitis without obstruction: Secondary | ICD-10-CM | POA: Diagnosis present

## 2022-01-05 DIAGNOSIS — R109 Unspecified abdominal pain: Secondary | ICD-10-CM | POA: Diagnosis present

## 2022-01-05 DIAGNOSIS — K859 Acute pancreatitis without necrosis or infection, unspecified: Secondary | ICD-10-CM | POA: Diagnosis present

## 2022-01-05 DIAGNOSIS — O99612 Diseases of the digestive system complicating pregnancy, second trimester: Principal | ICD-10-CM | POA: Diagnosis present

## 2022-01-05 DIAGNOSIS — Z9049 Acquired absence of other specified parts of digestive tract: Secondary | ICD-10-CM

## 2022-01-05 DIAGNOSIS — Z3492 Encounter for supervision of normal pregnancy, unspecified, second trimester: Secondary | ICD-10-CM

## 2022-01-05 HISTORY — DX: Calculus of gallbladder without cholecystitis without obstruction: K80.20

## 2022-01-05 LAB — URINALYSIS, ROUTINE W REFLEX MICROSCOPIC
Bilirubin Urine: NEGATIVE
Glucose, UA: NEGATIVE mg/dL
Hgb urine dipstick: NEGATIVE
Ketones, ur: 20 mg/dL — AB
Leukocytes,Ua: NEGATIVE
Nitrite: NEGATIVE
Protein, ur: NEGATIVE mg/dL
Specific Gravity, Urine: 1.012 (ref 1.005–1.030)
pH: 7 (ref 5.0–8.0)

## 2022-01-05 LAB — CBC WITH DIFFERENTIAL/PLATELET
Abs Immature Granulocytes: 0.02 10*3/uL (ref 0.00–0.07)
Basophils Absolute: 0 10*3/uL (ref 0.0–0.1)
Basophils Relative: 0 %
Eosinophils Absolute: 0.1 10*3/uL (ref 0.0–0.5)
Eosinophils Relative: 1 %
HCT: 34.2 % — ABNORMAL LOW (ref 36.0–46.0)
Hemoglobin: 11.8 g/dL — ABNORMAL LOW (ref 12.0–15.0)
Immature Granulocytes: 0 %
Lymphocytes Relative: 21 %
Lymphs Abs: 1.4 10*3/uL (ref 0.7–4.0)
MCH: 31.1 pg (ref 26.0–34.0)
MCHC: 34.5 g/dL (ref 30.0–36.0)
MCV: 90.2 fL (ref 80.0–100.0)
Monocytes Absolute: 0.7 10*3/uL (ref 0.1–1.0)
Monocytes Relative: 11 %
Neutro Abs: 4.5 10*3/uL (ref 1.7–7.7)
Neutrophils Relative %: 67 %
Platelets: 260 10*3/uL (ref 150–400)
RBC: 3.79 MIL/uL — ABNORMAL LOW (ref 3.87–5.11)
RDW: 13.2 % (ref 11.5–15.5)
WBC: 6.8 10*3/uL (ref 4.0–10.5)
nRBC: 0 % (ref 0.0–0.2)

## 2022-01-05 LAB — TYPE AND SCREEN
ABO/RH(D): B POS
Antibody Screen: NEGATIVE

## 2022-01-05 LAB — COMPREHENSIVE METABOLIC PANEL
ALT: 14 U/L (ref 0–44)
AST: 18 U/L (ref 15–41)
Albumin: 3.1 g/dL — ABNORMAL LOW (ref 3.5–5.0)
Alkaline Phosphatase: 66 U/L (ref 38–126)
Anion gap: 7 (ref 5–15)
BUN: <5 mg/dL — ABNORMAL LOW (ref 6–20)
CO2: 22 mmol/L (ref 22–32)
Calcium: 8.7 mg/dL — ABNORMAL LOW (ref 8.9–10.3)
Chloride: 103 mmol/L (ref 98–111)
Creatinine, Ser: 0.5 mg/dL (ref 0.44–1.00)
GFR, Estimated: >60 mL/min (ref >60)
Glucose, Bld: 76 mg/dL (ref 70–99)
Potassium: 3.9 mmol/L (ref 3.5–5.1)
Sodium: 132 mmol/L — ABNORMAL LOW (ref 135–145)
Total Bilirubin: 0.6 mg/dL (ref 0.3–1.2)
Total Protein: 6.1 g/dL — ABNORMAL LOW (ref 6.5–8.1)

## 2022-01-05 LAB — RESP PANEL BY RT-PCR (FLU A&B, COVID) ARPGX2
Influenza A by PCR: NEGATIVE
Influenza B by PCR: NEGATIVE
SARS Coronavirus 2 by RT PCR: NEGATIVE

## 2022-01-05 LAB — AMYLASE: Amylase: 192 U/L — ABNORMAL HIGH (ref 28–100)

## 2022-01-05 LAB — LIPASE, BLOOD: Lipase: 81 U/L — ABNORMAL HIGH (ref 11–51)

## 2022-01-05 MED ORDER — LACTATED RINGERS IV BOLUS: 1000.0000 mL | Freq: Once | INTRAVENOUS | Status: DC

## 2022-01-05 MED ORDER — LACTATED RINGERS IV SOLN: INTRAVENOUS | Status: DC

## 2022-01-05 MED ORDER — LACTATED RINGERS IV BOLUS
1000.0000 mL | Freq: Once | INTRAVENOUS | Status: AC
  Administered 2022-01-05: 15:00:00 1000 mL via INTRAVENOUS

## 2022-01-05 MED ORDER — FENTANYL CITRATE (PF) 100 MCG/2ML IJ SOLN
50.0000 ug | INTRAMUSCULAR | Status: DC | PRN
  Administered 2022-01-06 (×2): 50 ug via INTRAVENOUS
  Filled 2022-01-05 (×2): qty 2

## 2022-01-05 MED ORDER — PRENATAL MULTIVITAMIN CH
1.0000 | ORAL_TABLET | Freq: Every day | ORAL | Status: DC
  Filled 2022-01-05: qty 1

## 2022-01-05 MED ORDER — CALCIUM CARBONATE ANTACID 500 MG PO CHEW: 2.0000 | CHEWABLE_TABLET | ORAL | Status: DC | PRN

## 2022-01-05 MED ORDER — SODIUM CHLORIDE 0.9 % IV SOLN
8.0000 mg | Freq: Once | INTRAVENOUS | Status: AC
  Administered 2022-01-05: 16:00:00 8 mg via INTRAVENOUS
  Filled 2022-01-05: qty 4

## 2022-01-05 MED ORDER — ACETAMINOPHEN 325 MG PO TABS
650.0000 mg | ORAL_TABLET | ORAL | Status: DC | PRN
  Administered 2022-01-07: 650 mg via ORAL
  Filled 2022-01-05 (×3): qty 2

## 2022-01-05 MED ORDER — ZOLPIDEM TARTRATE 5 MG PO TABS: 5.0000 mg | ORAL_TABLET | Freq: Every evening | ORAL | Status: DC | PRN

## 2022-01-05 MED ORDER — IOHEXOL 300 MG/ML  SOLN
75.0000 mL | Freq: Once | INTRAMUSCULAR | Status: AC | PRN
  Administered 2022-01-05: 17:00:00 75 mL via INTRAVENOUS

## 2022-01-05 MED ORDER — DOCUSATE SODIUM 100 MG PO CAPS
100.0000 mg | ORAL_CAPSULE | Freq: Every day | ORAL | Status: DC
  Administered 2022-01-07: 100 mg via ORAL
  Filled 2022-01-05 (×2): qty 1

## 2022-01-05 MED ORDER — FAMOTIDINE IN NACL 20-0.9 MG/50ML-% IV SOLN
20.0000 mg | Freq: Once | INTRAVENOUS | Status: AC
  Administered 2022-01-05: 15:00:00 20 mg via INTRAVENOUS
  Filled 2022-01-05: qty 50

## 2022-01-05 MED ORDER — KETOROLAC TROMETHAMINE 30 MG/ML IJ SOLN
30.0000 mg | Freq: Once | INTRAMUSCULAR | Status: AC
  Administered 2022-01-05: 15:00:00 30 mg via INTRAVENOUS
  Filled 2022-01-05: qty 1

## 2022-01-05 NOTE — Consult Note (Signed)
Reason for Consult: Abdominal pain, pancreatitis Referring Physician: Dr. Loleta DickerPinn  Angela Russo is an 31 y.o. female.  HPI: Patient is a 31 year old female, who comes in with abdominal pain.  Patient is 20 weeks 6 days.. Patient comes in with a 1 week history of abdominal pain she states that pain was epigastric and right upper quadrant.  Patient was seen in the ER on Monday discharged with antinausea medication.  She states this did not help and continue with nausea vomiting.  She states that she is taking little p.o.  She states she is able to tolerate liquids more than solids.  Patient denies any diarrhea at home.  Patient undergone CT and ultrasound.  This was significant for 1.2 cm gallstone, positive Murphy sign, however no gallbladder wall thickening.  CT scan today reveals cholelithiasis without signs of acute cholecystitis.  I did review the CT scans personally. Patient also with laboratory studies.  Patient did have an elevated lipase at 81, amylase of 192.  Patient with normal T bili at 0.6, normal LFTs.  Patient was admitted, general surgery and GI consulted.    Past Medical History:  Diagnosis Date   Gallstones    Headache    Pregnancy induced hypertension     Past Surgical History:  Procedure Laterality Date   APPENDECTOMY      Family History  Problem Relation Age of Onset   Cancer Mother        leukemia   Diabetes Father     Social History:  reports that she has never smoked. She has never used smokeless tobacco. She reports that she does not drink alcohol and does not use drugs.  Allergies: No Known Allergies  Medications: I have reviewed the patient's current medications.  Results for orders placed or performed during the hospital encounter of 01/05/22 (from the past 48 hour(s))  CBC with Differential/Platelet     Status: Abnormal   Collection Time: 01/05/22  2:43 PM  Result Value Ref Range   WBC 6.8 4.0 - 10.5 K/uL   RBC 3.79 (L) 3.87 - 5.11 MIL/uL    Hemoglobin 11.8 (L) 12.0 - 15.0 g/dL   HCT 16.134.2 (L) 09.636.0 - 04.546.0 %   MCV 90.2 80.0 - 100.0 fL   MCH 31.1 26.0 - 34.0 pg   MCHC 34.5 30.0 - 36.0 g/dL   RDW 40.913.2 81.111.5 - 91.415.5 %   Platelets 260 150 - 400 K/uL   nRBC 0.0 0.0 - 0.2 %   Neutrophils Relative % 67 %   Neutro Abs 4.5 1.7 - 7.7 K/uL   Lymphocytes Relative 21 %   Lymphs Abs 1.4 0.7 - 4.0 K/uL   Monocytes Relative 11 %   Monocytes Absolute 0.7 0.1 - 1.0 K/uL   Eosinophils Relative 1 %   Eosinophils Absolute 0.1 0.0 - 0.5 K/uL   Basophils Relative 0 %   Basophils Absolute 0.0 0.0 - 0.1 K/uL   Immature Granulocytes 0 %   Abs Immature Granulocytes 0.02 0.00 - 0.07 K/uL    Comment: Performed at [facility], [address] [place], [place] [address]  Comprehensive metabolic panel     Status: Abnormal   Collection Time: 01/05/22  2:43 PM  Result Value Ref Range   Sodium 132 (L) 135 - 145 mmol/L   Potassium 3.9 3.5 - 5.1 mmol/L   Chloride 103 98 - 111 mmol/L   CO2 22 22 - 32 mmol/L   Glucose, Bld 76 70 - 99 mg/dL  Comment: Glucose reference range applies only to samples taken after fasting for at least 8 hours.   BUN <5 (L) 6 - 20 mg/dL   Creatinine, Ser 2.130.50 0.44 - 1.00 mg/dL   Calcium 8.7 (L) 8.9 - 10.3 mg/dL   Total Protein 6.1 (L) 6.5 - 8.1 g/dL   Albumin 3.1 (L) 3.5 - 5.0 g/dL   AST 18 15 - 41 U/L   ALT 14 0 - 44 U/L   Alkaline Phosphatase 66 38 - 126 U/L   Total Bilirubin 0.6 0.3 - 1.2 mg/dL   GFR, Estimated >08>60 >65>60 mL/min    Comment: (NOTE) Calculated using the CKD-EPI Creatinine Equation (2021)    Anion gap 7 5 - 15    Comment: Performed at [facility], [address] [place], [place] [address]  Amylase     Status: Abnormal   Collection Time: 01/05/22  2:43 PM  Result Value Ref Range   Amylase 192 (H) 28 - 100 U/L    Comment: Performed at [facility], [address] [place], [place] [address]  Lipase, blood     Status: Abnormal   Collection Time: 01/05/22  2:43 PM  Result Value Ref  Range   Lipase 81 (H) 11 - 51 U/L    Comment: Performed at [facility], [address] [place], [place] [address]  Urinalysis, Routine w reflex microscopic Urine, Clean Catch     Status: Abnormal   Collection Time: 01/05/22  3:14 PM  Result Value Ref Range   Color, Urine YELLOW YELLOW   APPearance CLOUDY (A) CLEAR   Specific Gravity, Urine 1.012 1.005 - 1.030   pH 7.0 5.0 - 8.0   Glucose, UA NEGATIVE NEGATIVE mg/dL   Hgb urine dipstick NEGATIVE NEGATIVE   Bilirubin Urine NEGATIVE NEGATIVE   Ketones, ur 20 (A) NEGATIVE mg/dL   Protein, ur NEGATIVE NEGATIVE mg/dL   Nitrite NEGATIVE NEGATIVE   Leukocytes,Ua NEGATIVE NEGATIVE    Comment: Performed at [facility], [address] [place], [place] [address]  Resp Panel by RT-PCR (Flu A&B, Covid) Nasopharyngeal Swab     Status: None   Collection Time: 01/05/22  6:54 PM   Specimen: Nasopharyngeal Swab; Nasopharyngeal(NP) swabs in vial transport medium  Result Value Ref Range   SARS Coronavirus 2 by RT PCR NEGATIVE NEGATIVE    Comment: (NOTE) SARS-CoV-2 target nucleic acids are NOT DETECTED.  The SARS-CoV-2 RNA is generally detectable in upper respiratory specimens during the acute phase of infection. The lowest concentration of SARS-CoV-2 viral copies this assay can detect is 138 copies/mL. A negative result does not preclude SARS-Cov-2 infection and should not be used as the sole basis for treatment or other patient management decisions. A negative result may occur with  improper specimen collection/handling, submission of specimen other than nasopharyngeal swab, presence of viral mutation(s) within the areas targeted by this assay, and inadequate number of viral copies(<138 copies/mL). A negative result must be combined with clinical observations, patient history, and epidemiological information. The expected result is Negative.  Fact Sheet for Patients:  BloggerCourse.comhttps://www.fda.gov/media/152166/download  Fact Sheet for  Healthcare Providers:  SeriousBroker.ithttps://www.fda.gov/media/152162/download  This test is no t yet approved or cleared by the [place] FDA and  has been authorized for detection and/or diagnosis of SARS-CoV-2 by FDA under an Emergency Use Authorization (EUA). This EUA will remain  in effect (meaning this test can be used) for the duration of the COVID-19 declaration under Section 564(b)(1) of  the Act, 21 U.S.C.section 360bbb-3(b)(1), unless the authorization is terminated  or revoked sooner.       Influenza A by PCR NEGATIVE NEGATIVE   Influenza B by PCR NEGATIVE NEGATIVE    Comment: (NOTE) The Xpert Xpress SARS-CoV-2/FLU/RSV plus assay is intended as an aid in the diagnosis of influenza from Nasopharyngeal swab specimens and should not be used as a sole basis for treatment. Nasal washings and aspirates are unacceptable for Xpert Xpress SARS-CoV-2/FLU/RSV testing.  Fact Sheet for Patients: BloggerCourse.com  Fact Sheet for Healthcare Providers: SeriousBroker.it  This test is not yet approved or cleared by the [place] FDA and has been authorized for detection and/or diagnosis of SARS-CoV-2 by FDA under an Emergency Use Authorization (EUA). This EUA will remain in effect (meaning this test can be used) for the duration of the COVID-19 declaration under Section 564(b)(1) of the Act, 21 U.S.C. section 360bbb-3(b)(1), unless the authorization is terminated or revoked.  Performed at [facility], [address] [place], [place] [address]   Type and screen [facility]     Status: None   Collection Time: 01/05/22  7:45 PM  Result Value Ref Range   ABO/RH(D) B POS    Antibody Screen NEG    Sample Expiration      01/08/2022,2359 Performed at [facility], [address], [place], [place] [address]     CT ABDOMEN PELVIS W CONTRAST  Result Date: 01/05/2022 CLINICAL DATA:  Nausea, vomiting, abdominal  pain. EXAM: CT ABDOMEN AND PELVIS WITH CONTRAST TECHNIQUE: Multidetector CT imaging of the abdomen and pelvis was performed using the standard protocol following bolus administration of intravenous contrast. RADIATION DOSE REDUCTION: This exam was performed according to the departmental dose-optimization program which includes automated exposure control, adjustment of the mA and/or kV according to patient size and/or use of iterative reconstruction technique. CONTRAST:  35mL OMNIPAQUE IOHEXOL 300 MG/ML  SOLN COMPARISON:  Right upper quadrant ultrasound 01/03/2021 FINDINGS: Lower chest: No acute airspace disease or pleural effusion. Hepatobiliary: No focal liver abnormality. Gallstone is noted. No pericholecystic fat stranding or inflammation. No biliary dilatation. Pancreas: Unremarkable. No pancreatic ductal dilatation or surrounding inflammatory changes. Spleen: Normal in size without focal abnormality. Adrenals/Urinary Tract: No adrenal nodule. Mild to moderate right and minimal left hydronephrosis. No renal calculi or perinephric edema. Unremarkable urinary bladder. No bladder wall thickening. Stomach/Bowel: Decompressed stomach. No small bowel obstruction or inflammation. Small to moderate colonic stool burden without colonic wall thickening or inflammatory change. The appendix is not confidently visualized. No pericecal inflammation or evidence of appendicitis. No abnormal rectal distention. Vascular/Lymphatic: Normal caliber abdominal aorta. No acute vascular findings. No abdominopelvic adenopathy. Reproductive: Gravid uterus. Fetus is in cephalic presentation. The ovaries are not discretely visualized, there is no suspicious scratch the left ovary is visualized and normal. The right ovary is not definitively seen. There is no suspicious adnexal mass. Other: No free air or free fluid.  No abdominopelvic collection. Musculoskeletal: There are no acute or suspicious osseous abnormalities. IMPRESSION: 1. Mild to  moderate right and minimal left hydronephrosis without urolithiasis. This is likely secondary to pregnancy. 2. Gravid uterus. Fetus is in cephalic presentation. 3. Cholelithiasis without CT findings of acute cholecystitis. Electronically Signed   By: Narda Rutherford M.D.   On: 01/05/2022 17:47    Review of Systems Blood pressure (!) 101/55, pulse 99, temperature 98.1 F (36.7 C), temperature source Oral, resp. rate 16, height 5\' 2"  (1.575 m), weight 52.3 kg, last menstrual  period 08/12/2021, SpO2 99 %, unknown if currently breastfeeding. Physical Exam  Assessment/Plan: Patient is a 31 year old female with likely biliary pancreatitis 20 week 6days pregnancy   1.  Agree with aggressive hydration. 2.  Patient undergo MRCP to evaluate for possible choledocholithiasis. 3.  Likely would recommend lap chole prior to discharge to prevent any further bouts of pancreatitis and removal of gallstones.  Timing will be based on patient's physical exam and pain. 4.  We will follow along.   Moderate decision making  Axel Filler 01/05/2022, 9:23 PM

## 2022-01-05 NOTE — MAU Provider Note (Addendum)
History     CSN: [id number]  Arrival date and time: 01/05/22 1351   Event Date/Time   First Provider Initiated Contact with Patient 01/05/22 1435      Chief Complaint  Patient presents with   Abdominal Pain   Nausea   HPI  Angela Russo is a 31 y.o. female [phone] [id number] @ 2973w6d here with worsening upper abdomina pain, nausea and vomiting. She was seen on 1/23 with similar symptoms and was diagnosed with cholelithiasis. She went home and attempted to eat rice, bread and yogurt; she vomited all of these things. She is able to keep down fluids, however is not able to keep down solids. The pain in her abdomen has worsened since 1/23. The pain is in the epigastric/RUQ. The pain comes and goes. The pain worsens with vomiting and eating solids. Percocet helps some.   She called [facility] and they would not see her because they do not accept her insurance.   OB History     Gravida  4   Para  1   Term  1   Preterm      AB  2   Living  1      SAB  2   IAB  0   Ectopic      Multiple  0   Live Births  1        Obstetric Comments  Pre-e  2020         Past Medical History:  Diagnosis Date   Gallstones    Headache    Pregnancy induced hypertension     Past Surgical History:  Procedure Laterality Date   APPENDECTOMY      Family History  Problem Relation Age of Onset   Cancer Mother        leukemia   Diabetes Father     Social History   Tobacco Use   Smoking status: Never   Smokeless tobacco: Never  Vaping Use   Vaping Use: Never used  Substance Use Topics   Alcohol use: Never   Drug use: Never    Allergies: No Known Allergies  Medications Prior to Admission  Medication Sig Dispense Refill Last Dose   acetaminophen (TYLENOL) 500 MG tablet Take 1 tablet (500 mg total) by mouth every 6 (six) hours as needed. (Patient taking differently: Take 1,000 mg by mouth every 6 (six) hours as needed.) 30 tablet 0    aspirin EC 81 MG tablet  Take 81 mg by mouth daily. Swallow whole.      cyclobenzaprine (FLEXERIL) 10 MG tablet Take 1 tablet (10 mg total) by mouth 3 (three) times daily as needed for muscle spasms. 30 tablet 0    ibuprofen (ADVIL) 600 MG tablet Take 600 mg by mouth every 6 (six) hours as needed.      metoCLOPramide (REGLAN) 10 MG tablet Take 1 tablet (10 mg total) by mouth every 6 (six) hours. 30 tablet 0    ondansetron (ZOFRAN-ODT) 8 MG disintegrating tablet Take 1 tablet (8 mg total) by mouth every 8 (eight) hours as needed for nausea or vomiting. 20 tablet 0    oxyCODONE-acetaminophen (PERCOCET/ROXICET) 5-325 MG tablet Take 1 tablet by mouth every 6 (six) hours as needed for severe pain. 6 tablet 0    Prenatal Vit-Fe Fumarate-FA (MULTIVITAMIN-PRENATAL) 27-0.8 MG TABS tablet Take 1 tablet by mouth daily at 12 noon.      Results for orders placed or performed during the hospital encounter of 01/05/22 (from the  past 48 hour(s))  CBC with Differential/Platelet     Status: Abnormal   Collection Time: 01/05/22  2:43 PM  Result Value Ref Range   WBC 6.8 4.0 - 10.5 K/uL   RBC 3.79 (L) 3.87 - 5.11 MIL/uL   Hemoglobin 11.8 (L) 12.0 - 15.0 g/dL   HCT 16.1 (L) 09.6 - 04.5 %   MCV 90.2 80.0 - 100.0 fL   MCH 31.1 26.0 - 34.0 pg   MCHC 34.5 30.0 - 36.0 g/dL   RDW 40.9 81.1 - 91.4 %   Platelets 260 150 - 400 K/uL   nRBC 0.0 0.0 - 0.2 %   Neutrophils Relative % 67 %   Neutro Abs 4.5 1.7 - 7.7 K/uL   Lymphocytes Relative 21 %   Lymphs Abs 1.4 0.7 - 4.0 K/uL   Monocytes Relative 11 %   Monocytes Absolute 0.7 0.1 - 1.0 K/uL   Eosinophils Relative 1 %   Eosinophils Absolute 0.1 0.0 - 0.5 K/uL   Basophils Relative 0 %   Basophils Absolute 0.0 0.0 - 0.1 K/uL   Immature Granulocytes 0 %   Abs Immature Granulocytes 0.02 0.00 - 0.07 K/uL    Comment: Performed at [facility], [address] [place], [place] [address]  Comprehensive metabolic panel     Status: Abnormal   Collection Time: 01/05/22  2:43 PM  Result Value  Ref Range   Sodium 132 (L) 135 - 145 mmol/L   Potassium 3.9 3.5 - 5.1 mmol/L   Chloride 103 98 - 111 mmol/L   CO2 22 22 - 32 mmol/L   Glucose, Bld 76 70 - 99 mg/dL    Comment: Glucose reference range applies only to samples taken after fasting for at least 8 hours.   BUN <5 (L) 6 - 20 mg/dL   Creatinine, Ser 6.21 0.44 - 1.00 mg/dL   Calcium 8.7 (L) 8.9 - 10.3 mg/dL   Total Protein 6.1 (L) 6.5 - 8.1 g/dL   Albumin 3.1 (L) 3.5 - 5.0 g/dL   AST 18 15 - 41 U/L   ALT 14 0 - 44 U/L   Alkaline Phosphatase 66 38 - 126 U/L   Total Bilirubin 0.6 0.3 - 1.2 mg/dL   GFR, Estimated >30 >86 mL/min    Comment: (NOTE) Calculated using the CKD-EPI Creatinine Equation (2021)    Anion gap 7 5 - 15    Comment: Performed at [facility], [address], [place], [place] [address]  Amylase     Status: Abnormal   Collection Time: 01/05/22  2:43 PM  Result Value Ref Range   Amylase 192 (H) 28 - 100 U/L    Comment: Performed at [facility], [address], [place], [place] [address]  Lipase, blood     Status: Abnormal   Collection Time: 01/05/22  2:43 PM  Result Value Ref Range   Lipase 81 (H) 11 - 51 U/L    Comment: Performed at [facility], [address] [place], [place] [address]  Urinalysis, Routine w reflex microscopic Urine, Clean Catch     Status: Abnormal   Collection Time: 01/05/22  3:14 PM  Result Value Ref Range   Color, Urine YELLOW YELLOW   APPearance CLOUDY (A) CLEAR   Specific Gravity, Urine 1.012 1.005 - 1.030   pH 7.0 5.0 - 8.0   Glucose, UA NEGATIVE NEGATIVE mg/dL   Hgb urine dipstick NEGATIVE NEGATIVE   Bilirubin Urine NEGATIVE NEGATIVE  Ketones, ur 20 (A) NEGATIVE mg/dL   Protein, ur NEGATIVE NEGATIVE mg/dL   Nitrite NEGATIVE NEGATIVE   Leukocytes,Ua NEGATIVE NEGATIVE    Comment: Performed at [facility], [address] [place], [place] [address]    Review of Systems  Constitutional:  Negative for fever.  Gastrointestinal:  Positive for  abdominal pain, nausea and vomiting.  Genitourinary:  Negative for vaginal bleeding and vaginal discharge.  Physical Exam   Blood pressure (!) 101/57, pulse 87, temperature 98.1 F (36.7 C), temperature source Oral, resp. rate 16, height 5\' 2"  (1.575 m), weight 52.3 kg, last menstrual period 08/12/2021, SpO2 100 %, unknown if currently breastfeeding.  Patient Vitals for the past 24 hrs:  BP Temp Temp src Pulse Resp SpO2 Height Weight  01/05/22 1821 (!) 101/57 98.1 F (36.7 C) Oral 87 16 100 % -- --  01/05/22 1415 (!) 101/55 98.3 F (36.8 C) Oral 92 16 100 % 5\' 2"  (1.575 m) 52.3 kg    Physical Exam Vitals and nursing note reviewed.  Constitutional:      General: She is not in acute distress.    Appearance: She is not ill-appearing, toxic-appearing or diaphoretic.  HENT:     Head: Normocephalic.  Abdominal:     Palpations: Abdomen is soft.     Tenderness: There is generalized abdominal tenderness and tenderness in the right upper quadrant and epigastric area. Negative signs include Murphy's sign.  Skin:    General: Skin is warm.  Neurological:     Mental Status: She is alert and oriented to person, place, and time.  Psychiatric:        Mood and Affect: Mood normal.   CT ABDOMEN PELVIS W CONTRAST  Result Date: 01/05/2022 CLINICAL DATA:  Nausea, vomiting, abdominal pain. EXAM: CT ABDOMEN AND PELVIS WITH CONTRAST TECHNIQUE: Multidetector CT imaging of the abdomen and pelvis was performed using the standard protocol following bolus administration of intravenous contrast. RADIATION DOSE REDUCTION: This exam was performed according to the departmental dose-optimization program which includes automated exposure control, adjustment of the mA and/or kV according to patient size and/or use of iterative reconstruction technique. CONTRAST:  27mL OMNIPAQUE IOHEXOL 300 MG/ML  SOLN COMPARISON:  Right upper quadrant ultrasound 01/03/2021 FINDINGS: Lower chest: No acute airspace disease or pleural  effusion. Hepatobiliary: No focal liver abnormality. Gallstone is noted. No pericholecystic fat stranding or inflammation. No biliary dilatation. Pancreas: Unremarkable. No pancreatic ductal dilatation or surrounding inflammatory changes. Spleen: Normal in size without focal abnormality. Adrenals/Urinary Tract: No adrenal nodule. Mild to moderate right and minimal left hydronephrosis. No renal calculi or perinephric edema. Unremarkable urinary bladder. No bladder wall thickening. Stomach/Bowel: Decompressed stomach. No small bowel obstruction or inflammation. Small to moderate colonic stool burden without colonic wall thickening or inflammatory change. The appendix is not confidently visualized. No pericecal inflammation or evidence of appendicitis. No abnormal rectal distention. Vascular/Lymphatic: Normal caliber abdominal aorta. No acute vascular findings. No abdominopelvic adenopathy. Reproductive: Gravid uterus. Fetus is in cephalic presentation. The ovaries are not discretely visualized, there is no suspicious scratch the left ovary is visualized and normal. The right ovary is not definitively seen. There is no suspicious adnexal mass. Other: No free air or free fluid.  No abdominopelvic collection. Musculoskeletal: There are no acute or suspicious osseous abnormalities. IMPRESSION: 1. Mild to moderate right and minimal left hydronephrosis without urolithiasis. This is likely secondary to pregnancy. 2. Gravid uterus. Fetus is in cephalic presentation. 3. Cholelithiasis without CT findings of acute cholecystitis. Electronically Signed  By: Narda Rutherford M.D.   On: 01/05/2022 17:47   US Abdomen Limited RUQ (LIVER/GB)  Result Date: 01/03/2022 CLINICAL DATA:  Abdominal pain in pregnancy EXAM: ULTRASOUND ABDOMEN LIMITED RIGHT UPPER QUADRANT COMPARISON:  None. FINDINGS: Gallbladder: Mobile 1.2 cm gallstone. No wall thickening visualized. A positive sonographic Murphy sign noted by sonographer. Common bile  duct: Diameter: 4 mm. Liver: No focal lesion identified. Within normal limits in parenchymal echogenicity. Portal vein is patent on color Doppler imaging with normal direction of blood flow towards the liver. Other: None. IMPRESSION: Cholelithiasis. No gallbladder wall thickening is seen, although a positive sonographic Eulah Pont sign was noted. Findings are suspicious for acute cholecystitis. Electronically Signed   By: Duanne Guess D.O.   On: 01/03/2022 18:53    MAU Course  Procedures None   MDM  CBC & CMP ordered Amylase, lipase. LR bolus x1 with pepcid, zofran and toradol CT abd/pelvis ordered given she had [place] 2 days ago. Report given to Dorothy Puffer NP who resumes care of the patient.  Venia Carbon, NP  Care of patient assumed Patient with 2nd visit in 2 days for same symptoms with new gallstones Labs return with grossly normal and unchanged CBC/CMP Amylase/Lipase elevated and elevated compared to labs 2 days ago UA: cloudy/20 ketones CT: 1. Mild to moderate right and minimal left hydronephrosis without urolithiasis. This is likely secondary to pregnancy. 2. Gravid uterus. Fetus is in cephalic presentation. 3. Cholelithiasis without CT findings of acute cholecystitis. Patient with resolved nausea/vomiting/pain after medication Consulted with general surgery, who recommends gallbladder removal and will consult in the morning if patient is admitted, but recommends consult with GI for elevated pancreatic enzymes Consulted with GI who recommends further imaging with MRCP and multiple fluid boluses with fluids running at 215mL/hour after boluses are complete. Second bolus ordered now. Called and notified Dr. Mora Appl of need for admission to [facility] with additional imaging, fluid bolus and consult with general surgery in the morning, Dr. Mora Appl agrees with plan Admit to [facility]  Orders Placed This Encounter  Procedures   Resp Panel by RT-PCR (Flu A&B, Covid) Nasopharyngeal Swab     Standing Status:   Standing    Number of Occurrences:   1   CT ABDOMEN PELVIS W CONTRAST    Standing Status:   Standing    Number of Occurrences:   1    Order Specific Question:   Does the patient have a contrast media/X-ray dye allergy?    Answer:   No    Order Specific Question:   If indicated for the ordered procedure, I authorize the administration of contrast media per Radiology protocol    Answer:   Yes    Order Specific Question:   Is Oral Contrast requested for this exam?    Answer:   No oral contrast    Order Specific Question:   Reason for No Oral Contrast    Answer:   Other    Order Specific Question:   Please answer why no oral contrast is requested    Answer:   Pregnancy.   Urinalysis, Routine w reflex microscopic Urine, Clean Catch    Standing Status:   Standing    Number of Occurrences:   1   CBC with Differential/Platelet    Standing Status:   Standing    Number of Occurrences:   1   Comprehensive metabolic panel    Standing Status:   Standing    Number of Occurrences:   1  Amylase    Standing Status:   Standing    Number of Occurrences:   1   Lipase, blood    Standing Status:   Standing    Number of Occurrences:   1   Admit to Inpatient (patient's expected length of stay will be greater than 2 midnights or inpatient only procedure)    Standing Status:   Standing    Number of Occurrences:   1    Order Specific Question:   Hospital Area    Answer:   [facility] [[id number]]    Order Specific Question:   Level of Care    Answer:   Antepartum [20]    Order Specific Question:   Covid Evaluation    Answer:   Asymptomatic Screening Protocol (No Symptoms)    Order Specific Question:   Diagnosis    Answer:   Pancreatitis, acute [[id number]]    Order Specific Question:   Admitting Physician    Answer:   Essie HartPINN, WALDA [[id number]]    Order Specific Question:   Attending Physician    Answer:   Essie HartPINN, WALDA [[id number]]    Order Specific Question:   Estimated length  of stay    Answer:   past midnight tomorrow    Order Specific Question:   Certification:    Answer:   I certify this patient will need inpatient services for at least 2 midnights   Meds ordered this encounter  Medications   lactated ringers bolus 1,000 mL   ketorolac (TORADOL) 30 MG/ML injection 30 mg   ondansetron (ZOFRAN) 8 mg in sodium chloride 0.9 % 50 mL IVPB   famotidine (PEPCID) IVPB 20 mg premix   iohexol (OMNIPAQUE) 300 MG/ML solution 75 mL   lactated ringers bolus 1,000 mL   Assessment and Plan   1. Acute pancreatitis, unspecified complication status, unspecified pancreatitis type   2. [redacted] weeks gestation of pregnancy    [facility]

## 2022-01-05 NOTE — H&P (Signed)
OB ADMISSION/ HISTORY & PHYSICAL:  Admission Date: 01/05/2022  1:51 PM  Admit Diagnosis: acute pancreatitis IUP at 20+6 weeks  From MAU provider: [name] is a 31 y.o. female [phone] @ [redacted]w[redacted]d here with worsening upper abdomina pain, nausea and vomiting. She was seen on 1/23 with similar symptoms and was diagnosed with cholelithiasis. She went home and attempted to eat rice, bread and yogurt; she vomited all of these things. She is able to keep down fluids, however is not able to keep down solids. The pain in her abdomen has worsened since 1/23. The pain is in the epigastric/RUQ. The pain comes and goes. The pain worsens with vomiting and eating solids. Percocet helps some.    She called [facility] and they would not see her because they do not accept her insurance.   History of current pregnancy: [id number]   Patient entered care with [facility] at 12+4 wks.   EDC 05/19/22 by LMP and congruent w/ 9+2 wk U/S.   Anatomy scan:  20+4 wks, complete w/ fundal/posterior placenta.    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    Prenatal Labs: ABO, Rh: --/--/B POS (01/23 1941) Antibody: NEG (01/23 1941) Rubella:   immune (10/11/21) RPR: NON REACTIVE (01/23 1934)  HBsAg:   NR HIV:   NR GC/CHL: neg/neg Genetics: low-risk female, Horizon negative, normal AFP Covid/influenza vaccines: current/cureent   OB History  Gravida Para Term Preterm AB Living  4 1 1   2 1   SAB IAB Ectopic Multiple Live Births  2 0   0 1    # Outcome Date GA Lbr Len/2nd Weight Sex Delivery Anes PTL Lv  4 Current           3 Term 09/17/19 [redacted]w[redacted]d 10:45 / 02:08 2835 g F Vag-Spont EPI  LIV  2 SAB 2018          1 SAB 2018            Obstetric Comments  Pre-e  2020    Medical / Surgical History: Past medical history:  Past Medical History:  Diagnosis Date   Gallstones    Headache    Pregnancy  induced hypertension     Past surgical history:  Past Surgical History:  Procedure Laterality Date   APPENDECTOMY     Family History:  Family History  Problem Relation Age of Onset   Cancer Mother        leukemia   Diabetes Father     Social History:  reports that she has never smoked. She has never used smokeless tobacco. She reports that she does not drink alcohol and does not use drugs.  Allergies: Patient has no known allergies.   Current Medications at time of admission:  Prior to Admission medications   Medication Sig Start Date End Date Taking? Authorizing Provider  acetaminophen (TYLENOL) 500 MG tablet Take 1 tablet (500 mg total) by mouth every 6 (six) hours as needed. Patient taking differently: Take 1,000 mg by mouth every 6 (six) hours as needed. 10/17/21   Wende Mott, CNM  aspirin EC 81 MG tablet Take 81 mg by mouth daily. Swallow whole.    [provider]  cyclobenzaprine (FLEXERIL) 10 MG tablet Take 1 tablet (10 mg total) by mouth 3 (three) times daily as needed for muscle spasms. 11/22/21   Starr Lake, CNM  ibuprofen (ADVIL) 600 MG  tablet Take 600 mg by mouth every 6 (six) hours as needed.    [provider]  metoCLOPramide (REGLAN) 10 MG tablet Take 1 tablet (10 mg total) by mouth every 6 (six) hours. 01/03/22   Starr Lake, CNM  ondansetron (ZOFRAN-ODT) 8 MG disintegrating tablet Take 1 tablet (8 mg total) by mouth every 8 (eight) hours as needed for nausea or vomiting. 01/03/22   Starr Lake, CNM  oxyCODONE-acetaminophen (PERCOCET/ROXICET) 5-325 MG tablet Take 1 tablet by mouth every 6 (six) hours as needed for severe pain. 01/03/22   Starr Lake, CNM  Prenatal Vit-Fe Fumarate-FA (MULTIVITAMIN-PRENATAL) 27-0.8 MG TABS tablet Take 1 tablet by mouth daily at 12 noon.    [provider]    Review of Systems: Constitutional: Negative   HENT: Negative   Eyes: Negative    Respiratory: Negative   Cardiovascular: Negative   Gastrointestinal: pain Genitourinary: neg for bloody show, neg for LOF   Musculoskeletal: Negative   Skin: Negative   Neurological: Negative   Endo/Heme/Allergies: Negative   Psychiatric/Behavioral: Negative    Physical Exam: VS: Blood pressure (!) 101/57, pulse 87, temperature 98.1 F (36.7 C), temperature source Oral, resp. rate 16, height 5\' 2"  (1.575 m), weight 52.3 kg, last menstrual period 08/12/2021, SpO2 100 %, unknown if currently breastfeeding. AAO x3, no signs of distress Cardiovascular: RRR Respiratory: Lung fields clear to ausculation GU/GI: Abdomen gravid, non-tender, non-distended Extremities: neg for edema, negative for pain, tenderness, and cords  Cervical exam: deferred FHR: doppler 151 TOCO: N/A   Prenatal Transfer Tool  Maternal Diabetes: No Genetic Screening: Normal Maternal Ultrasounds/Referrals: Normal Fetal Ultrasounds or other Referrals:  None Maternal Substance Abuse:  No Significant Maternal Medications:  None Significant Maternal Lab Results: None    Assessment: 31 y.o. [id number] [redacted]w[redacted]d Acute pancreatitis  Plan:  Admit to Antepartum Routine admission orders LR 200 mL/hr MRCP without contrast GI consult Fentanyl PRN for pain management Plan developed by Dr. Alwyn Pea in concert with MAU providers   Arrie Eastern MSN, CNM 01/05/2022 7:32 PM

## 2022-01-05 NOTE — MAU Note (Signed)
[name] is a 31 y.o. at [redacted]w[redacted]d here in MAU reporting: Dx with gallstones on Monday and has been on a clear liquid diet since then. Today tried to introduce some yogurt and rice pudding and since then has been having nausea, emesis, and RUQ pain. Denies bleeding or LOF. Took some percocet around 9 and that did provide some relief.   Onset of complaint: today  Pain score: 5/10  Vitals:   01/05/22 1415  BP: (!) 101/55  Pulse: 92  Resp: 16  Temp: 98.3 F (36.8 C)  SpO2: 100%     FHT:156  Lab orders placed from triage: UA, unable to give sample

## 2022-01-06 ENCOUNTER — Inpatient Hospital Stay (HOSPITAL_COMMUNITY): Payer: Medicaid Other | Admitting: Anesthesiology

## 2022-01-06 ENCOUNTER — Encounter (HOSPITAL_COMMUNITY): Payer: Self-pay | Admitting: Obstetrics & Gynecology

## 2022-01-06 ENCOUNTER — Encounter (HOSPITAL_COMMUNITY)
Admission: AD | Disposition: A | Discharge status: Home / Self Care | Payer: Self-pay | Source: Home / Self Care | Attending: Obstetrics & Gynecology

## 2022-01-06 ENCOUNTER — Other Ambulatory Visit: Payer: Self-pay

## 2022-01-06 ENCOUNTER — Inpatient Hospital Stay (HOSPITAL_COMMUNITY): Payer: Medicaid Other

## 2022-01-06 DIAGNOSIS — O99612 Diseases of the digestive system complicating pregnancy, second trimester: Secondary | ICD-10-CM

## 2022-01-06 DIAGNOSIS — K8020 Calculus of gallbladder without cholecystitis without obstruction: Secondary | ICD-10-CM | POA: Diagnosis present

## 2022-01-06 HISTORY — PX: CHOLECYSTECTOMY: SHX55

## 2022-01-06 SURGERY — LAPAROSCOPIC CHOLECYSTECTOMY WITH INTRAOPERATIVE CHOLANGIOGRAM
Anesthesia: General

## 2022-01-06 MED ORDER — KETOROLAC TROMETHAMINE 30 MG/ML IJ SOLN
30.0000 mg | Freq: Four times a day (QID) | INTRAMUSCULAR | Status: DC
  Administered 2022-01-06 – 2022-01-07 (×3): 30 mg via INTRAVENOUS
  Filled 2022-01-06 (×3): qty 1

## 2022-01-06 MED ORDER — PROPOFOL 10 MG/ML IV BOLUS
INTRAVENOUS | Status: DC | PRN
  Administered 2022-01-06: 150 mg via INTRAVENOUS

## 2022-01-06 MED ORDER — CHLORHEXIDINE GLUCONATE 0.12 % MT SOLN
OROMUCOSAL | Status: AC
  Administered 2022-01-06: 15 mL
  Filled 2022-01-06: qty 15

## 2022-01-06 MED ORDER — FENTANYL CITRATE (PF) 100 MCG/2ML IJ SOLN
INTRAMUSCULAR | Status: AC
  Filled 2022-01-06: qty 2

## 2022-01-06 MED ORDER — LIDOCAINE 2% (20 MG/ML) 5 ML SYRINGE
INTRAMUSCULAR | Status: AC
  Filled 2022-01-06: qty 5

## 2022-01-06 MED ORDER — ROCURONIUM BROMIDE 10 MG/ML (PF) SYRINGE
PREFILLED_SYRINGE | INTRAVENOUS | Status: AC
  Filled 2022-01-06: qty 10

## 2022-01-06 MED ORDER — PHENYLEPHRINE 40 MCG/ML (10ML) SYRINGE FOR IV PUSH (FOR BLOOD PRESSURE SUPPORT)
PREFILLED_SYRINGE | INTRAVENOUS | Status: DC | PRN
  Administered 2022-01-06 (×2): 120 ug via INTRAVENOUS

## 2022-01-06 MED ORDER — ONDANSETRON HCL 4 MG/2ML IJ SOLN
INTRAMUSCULAR | Status: AC
  Filled 2022-01-06: qty 2

## 2022-01-06 MED ORDER — FENTANYL CITRATE (PF) 100 MCG/2ML IJ SOLN
25.0000 ug | INTRAMUSCULAR | Status: DC | PRN
  Administered 2022-01-06 (×2): 25 ug via INTRAVENOUS

## 2022-01-06 MED ORDER — SUGAMMADEX SODIUM 200 MG/2ML IV SOLN
INTRAVENOUS | Status: DC | PRN
  Administered 2022-01-06: 110 mg via INTRAVENOUS

## 2022-01-06 MED ORDER — 0.9 % SODIUM CHLORIDE (POUR BTL) OPTIME
TOPICAL | Status: DC | PRN
Start: 2022-01-06 — End: 2022-01-06
  Administered 2022-01-06: 1000 mL

## 2022-01-06 MED ORDER — ONDANSETRON HCL 4 MG/2ML IJ SOLN
INTRAMUSCULAR | Status: DC | PRN
  Administered 2022-01-06: 4 mg via INTRAVENOUS

## 2022-01-06 MED ORDER — ACETAMINOPHEN 500 MG PO TABS
1000.0000 mg | ORAL_TABLET | Freq: Once | ORAL | Status: AC
Start: 2022-01-06 — End: 2022-01-06
  Administered 2022-01-06: 1000 mg via ORAL
  Filled 2022-01-06: qty 2

## 2022-01-06 MED ORDER — BUPIVACAINE-EPINEPHRINE (PF) 0.25% -1:200000 IJ SOLN
INTRAMUSCULAR | Status: AC
  Filled 2022-01-06: qty 30

## 2022-01-06 MED ORDER — SODIUM CHLORIDE 0.9 % IR SOLN
Status: DC | PRN
  Administered 2022-01-06: 1000 mL

## 2022-01-06 MED ORDER — CEFAZOLIN SODIUM-DEXTROSE 2-3 GM-%(50ML) IV SOLR
INTRAVENOUS | Status: DC | PRN
  Administered 2022-01-06: 2 g via INTRAVENOUS

## 2022-01-06 MED ORDER — CEFAZOLIN SODIUM-DEXTROSE 2-4 GM/100ML-% IV SOLN
INTRAVENOUS | Status: AC
  Filled 2022-01-06: qty 100

## 2022-01-06 MED ORDER — AMISULPRIDE (ANTIEMETIC) 5 MG/2ML IV SOLN: 10.0000 mg | Freq: Once | INTRAVENOUS | Status: DC | PRN

## 2022-01-06 MED ORDER — LACTATED RINGERS IV SOLN: INTRAVENOUS | Status: DC | PRN

## 2022-01-06 MED ORDER — ROCURONIUM BROMIDE 10 MG/ML (PF) SYRINGE
PREFILLED_SYRINGE | INTRAVENOUS | Status: DC | PRN
Start: 2022-01-06 — End: 2022-01-06
  Administered 2022-01-06: 30 mg via INTRAVENOUS

## 2022-01-06 MED ORDER — SUCCINYLCHOLINE CHLORIDE 200 MG/10ML IV SOSY
PREFILLED_SYRINGE | INTRAVENOUS | Status: AC
  Filled 2022-01-06: qty 10

## 2022-01-06 MED ORDER — LIDOCAINE 2% (20 MG/ML) 5 ML SYRINGE
INTRAMUSCULAR | Status: DC | PRN
  Administered 2022-01-06: 50 mg via INTRAVENOUS

## 2022-01-06 MED ORDER — LACTATED RINGERS IV SOLN: INTRAVENOUS | Status: DC

## 2022-01-06 MED ORDER — PROPOFOL 10 MG/ML IV BOLUS
INTRAVENOUS | Status: AC
  Filled 2022-01-06: qty 20

## 2022-01-06 MED ORDER — BUPIVACAINE-EPINEPHRINE 0.25% -1:200000 IJ SOLN
INTRAMUSCULAR | Status: DC | PRN
  Administered 2022-01-06: 8 mL

## 2022-01-06 MED ORDER — CHLORHEXIDINE GLUCONATE 0.12 % MT SOLN: 15.0000 mL | Freq: Once | OROMUCOSAL | Status: AC

## 2022-01-06 MED ORDER — PHENYLEPHRINE HCL-NACL 20-0.9 MG/250ML-% IV SOLN
INTRAVENOUS | Status: DC | PRN
  Administered 2022-01-06: 30 ug/min via INTRAVENOUS

## 2022-01-06 MED ORDER — ORAL CARE MOUTH RINSE: 15.0000 mL | Freq: Once | OROMUCOSAL | Status: AC

## 2022-01-06 MED ORDER — SUCCINYLCHOLINE CHLORIDE 200 MG/10ML IV SOSY
PREFILLED_SYRINGE | INTRAVENOUS | Status: DC | PRN
Start: 2022-01-06 — End: 2022-01-06
  Administered 2022-01-06: 100 mg via INTRAVENOUS

## 2022-01-06 MED ORDER — SIMETHICONE 80 MG PO CHEW
80.0000 mg | CHEWABLE_TABLET | ORAL | Status: DC | PRN
  Administered 2022-01-06: 80 mg via ORAL
  Filled 2022-01-06: qty 1

## 2022-01-06 MED ORDER — SODIUM CHLORIDE 0.9 % IV SOLN
8.0000 mg | Freq: Four times a day (QID) | INTRAVENOUS | Status: DC | PRN
  Administered 2022-01-06 (×2): 8 mg via INTRAVENOUS
  Filled 2022-01-06 (×2): qty 4

## 2022-01-06 MED ORDER — DEXAMETHASONE SODIUM PHOSPHATE 10 MG/ML IJ SOLN
INTRAMUSCULAR | Status: AC
  Filled 2022-01-06: qty 3

## 2022-01-06 MED ORDER — ONDANSETRON HCL 4 MG/2ML IJ SOLN
INTRAMUSCULAR | Status: AC
  Filled 2022-01-06: qty 6

## 2022-01-06 MED ORDER — HYDROCODONE-ACETAMINOPHEN 5-325 MG PO TABS
1.0000 | ORAL_TABLET | ORAL | Status: DC | PRN
  Administered 2022-01-06 (×2): 2 via ORAL
  Filled 2022-01-06 (×2): qty 2

## 2022-01-06 MED ORDER — PROMETHAZINE HCL 25 MG/ML IJ SOLN: 6.2500 mg | INTRAMUSCULAR | Status: DC | PRN

## 2022-01-06 MED ORDER — FENTANYL CITRATE (PF) 250 MCG/5ML IJ SOLN
INTRAMUSCULAR | Status: DC | PRN
  Administered 2022-01-06: 100 ug via INTRAVENOUS

## 2022-01-06 MED ORDER — FENTANYL CITRATE (PF) 250 MCG/5ML IJ SOLN
INTRAMUSCULAR | Status: AC
  Filled 2022-01-06: qty 5

## 2022-01-06 SURGICAL SUPPLY — 40 items
ADH SKN CLS APL DERMABOND .7 (GAUZE/BANDAGES/DRESSINGS) ×1
APL PRP STRL LF DISP 70% ISPRP (MISCELLANEOUS) ×1
APPLIER CLIP 5 13 M/L LIGAMAX5 (MISCELLANEOUS) ×2
APR CLP MED LRG 5 ANG JAW (MISCELLANEOUS) ×1
BAG COUNTER SPONGE SURGICOUNT (BAG) ×2 IMPLANT
BAG SPEC RTRVL 10 TROC 200 (ENDOMECHANICALS) ×1
BAG SPNG CNTER NS LX DISP (BAG) ×1
CANISTER SUCT 3000ML PPV (MISCELLANEOUS) ×2 IMPLANT
CHLORAPREP W/TINT 26 (MISCELLANEOUS) ×2 IMPLANT
CLIP APPLIE 5 13 M/L LIGAMAX5 (MISCELLANEOUS) ×1 IMPLANT
COVER SURGICAL LIGHT HANDLE (MISCELLANEOUS) ×2 IMPLANT
DERMABOND ADVANCED (GAUZE/BANDAGES/DRESSINGS) ×1
DERMABOND ADVANCED .7 DNX12 (GAUZE/BANDAGES/DRESSINGS) ×1 IMPLANT
ELECT REM PT RETURN 9FT ADLT (ELECTROSURGICAL) ×2
ELECTRODE REM PT RTRN 9FT ADLT (ELECTROSURGICAL) ×1 IMPLANT
GLOVE SURG ENC MOIS LTX SZ7 (GLOVE) ×2 IMPLANT
GLOVE SURG UNDER POLY LF SZ7.5 (GLOVE) ×2 IMPLANT
GOWN STRL REUS W/ TWL LRG LVL3 (GOWN DISPOSABLE) ×3 IMPLANT
GOWN STRL REUS W/TWL LRG LVL3 (GOWN DISPOSABLE) ×6
GRASPER SUT TROCAR 14GX15 (MISCELLANEOUS) ×2 IMPLANT
KIT BASIN OR (CUSTOM PROCEDURE TRAY) ×2 IMPLANT
KIT TURNOVER KIT B (KITS) ×2 IMPLANT
NS IRRIG 1000ML POUR BTL (IV SOLUTION) ×2 IMPLANT
PAD ARMBOARD 7.5X6 YLW CONV (MISCELLANEOUS) ×2 IMPLANT
POUCH RETRIEVAL ECOSAC 10 (ENDOMECHANICALS) ×1 IMPLANT
POUCH RETRIEVAL ECOSAC 10MM (ENDOMECHANICALS) ×1
SCISSORS LAP 5X35 DISP (ENDOMECHANICALS) ×2 IMPLANT
SET IRRIG TUBING LAPAROSCOPIC (IRRIGATION / IRRIGATOR) ×2 IMPLANT
SET TUBE SMOKE EVAC HIGH FLOW (TUBING) ×2 IMPLANT
SLEEVE ENDOPATH XCEL 5M (ENDOMECHANICALS) ×4 IMPLANT
SPECIMEN JAR SMALL (MISCELLANEOUS) ×2 IMPLANT
STRIP CLOSURE SKIN 1/2X4 (GAUZE/BANDAGES/DRESSINGS) ×2 IMPLANT
SUT MNCRL AB 4-0 PS2 18 (SUTURE) ×2 IMPLANT
SUT VICRYL 0 UR6 27IN ABS (SUTURE) ×2 IMPLANT
TOWEL GREEN STERILE (TOWEL DISPOSABLE) ×2 IMPLANT
TOWEL GREEN STERILE FF (TOWEL DISPOSABLE) ×2 IMPLANT
TRAY LAPAROSCOPIC MC (CUSTOM PROCEDURE TRAY) ×2 IMPLANT
TROCAR XCEL BLUNT TIP 100MML (ENDOMECHANICALS) ×2 IMPLANT
TROCAR XCEL NON-BLD 5MMX100MML (ENDOMECHANICALS) ×2 IMPLANT
WATER STERILE IRR 1000ML POUR (IV SOLUTION) ×2 IMPLANT

## 2022-01-06 NOTE — Anesthesia Preprocedure Evaluation (Addendum)
Anesthesia Evaluation  Patient identified by MRN, date of birth, ID band Patient awake    Reviewed: Allergy & Precautions, H&P , NPO status , Patient's Chart, lab work & pertinent test results  History of Anesthesia Complications Negative for: history of anesthetic complications  Airway Mallampati: II   Neck ROM: full    Dental   Pulmonary neg pulmonary ROS,    breath sounds clear to auscultation       Cardiovascular hypertension,  Rhythm:regular Rate:Normal  Hx of pregancy induced hypertension   Neuro/Psych  Headaches,    GI/Hepatic Neg liver ROS, Acute pancreatitis due to cholelithiasis   Endo/Other  negative endocrine ROS  Renal/GU negative Renal ROS     Musculoskeletal negative musculoskeletal ROS (+)   Abdominal   Peds  Hematology negative hematology ROS (+)   Anesthesia Other Findings   Reproductive/Obstetrics (+) Pregnancy [redacted] weeks pregnant                            Anesthesia Physical Anesthesia Plan  ASA: 2  Anesthesia Plan: General   Post-op Pain Management: Tylenol PO (pre-op)   Induction: Intravenous  PONV Risk Score and Plan: 4 or greater and Ondansetron, Diphenhydramine and Treatment may vary due to age or medical condition  Airway Management Planned: Oral ETT  Additional Equipment:   Intra-op Plan:   Post-operative Plan: Extubation in OR  Informed Consent: I have reviewed the patients History and Physical, chart, labs and discussed the procedure including the risks, benefits and alternatives for the proposed anesthesia with the patient or authorized representative who has indicated his/her understanding and acceptance.     Dental advisory given  Plan Discussed with: Anesthesiologist, CRNA and Surgeon  Anesthesia Plan Comments:        Anesthesia Quick Evaluation

## 2022-01-06 NOTE — Anesthesia Postprocedure Evaluation (Signed)
Anesthesia Post Note  Patient: Angela Russo  Procedure(s) Performed: LAPAROSCOPIC CHOLECYSTECTOMY     Patient location during evaluation: PACU Anesthesia Type: General Level of consciousness: awake and alert and oriented Pain management: pain level controlled Vital Signs Assessment: post-procedure vital signs reviewed and stable Respiratory status: spontaneous breathing, nonlabored ventilation and respiratory function stable Cardiovascular status: blood pressure returned to baseline Postop Assessment: no apparent nausea or vomiting Anesthetic complications: no   No notable events documented.  Last Vitals:  Vitals:   01/06/22 1620 01/06/22 1635  BP: 113/69 105/65  Pulse: 97 98  Resp: (!) 23 (!) 22  Temp:    SpO2: 97% 96%    Last Pain:  Vitals:   01/06/22 1635  TempSrc:   PainSc: Asleep                 Shanda Howells

## 2022-01-06 NOTE — Op Note (Signed)
Preoperative diagnosis: acute cholecystitis Postoperative diagnosis: Same as above Procedure: Laparoscopic cholecystectomy Surgeon: Dr. Harden Mo Anesthesia: general Estimated blood loss: Minimal Complications: None Drains: None Specimens: Gallbladder and contents to pathology Special count was correct x2 at end of operation Disposition recovery stable condition  Indications:  31 year old female, who comes in with abdominal pain.  Patient is 20 weeks 6 days. Patient comes in with a 1 week history of abdominal pain she states that pain was epigastric and right upper quadrant. Patient has undergone CT and ultrasound.  This was significant for 1.2 cm gallstone, positive Murphy sign, however no gallbladder wall thickening.  CT scan today reveals cholelithiasis without signs of acute cholecystitis.  Patient did have an elevated lipase at 81, amylase of 192.  Patient with normal T bili at 0.6, normal LFTs. She just has acute cholecystitis on exam and imaging to me. I discussed proceeding with lap chole.   Procedure: After informed consent was obtained the patient was taken to the OR.   She was given antibiotics.  SCDs were in place.  She was placed under general anesthesia without complication.  She was prepped and draped in the standard sterile surgical fashion.  Surgical timeout was then performed.  I infiltrated Marcaine above the umbilicus.  I made a vertical incision with an 11 blade.  I grasped the fascia with a Kocher clamp.  I incised this sharply and entered the peritoneal cavity bluntly without injury.  I then placed a 0 Vicryl pursestring suture through the fascia.  I inserted a Hassan trocar and insufflated the abdomen to 15 mmHg pressure.  I then inserted 3 additional 5 mm trocars in epigastrium and right upper quadrant under direct vision without complication. The uterus was avoided.  The gallbladder was noted to have cholecystitis.  I was able to retract this cephalad and lateral.  I  then was able to dissect the triangle and obtain the critical view of safety. I was able to visualize the common duct as well as the cystic duct clearly confirmed in the critical view of safety.  I then clipped the cystic duct and divided it leaving 2 clips in place.  These clips traversed the duct and the duct was viable.   I then treated the artery in a similar fashion. The gallbladder was then removed from the liver bed and placed in a retrieval bag.  This was removed from the abdomen.  Hemostasis was observed.  I removed the [facility] trocar and tied the pursestring down.  I placed 2 additional 0 Vicryl sutures using the suture passer device to completely better obliterate the umbilical defect.  The trocars were then removed and the abdomen desufflated.  These were closed with 4-0 Monocryl and glue.  She tolerated this well was extubated and transferred to recovery stable

## 2022-01-06 NOTE — Transfer of Care (Signed)
Immediate Anesthesia Transfer of Care Note  Patient: Angela Russo  Procedure(s) Performed: LAPAROSCOPIC CHOLECYSTECTOMY  Patient Location: PACU  Anesthesia Type:General  Level of Consciousness: awake, alert  and oriented  Airway & Oxygen Therapy: Patient Spontanous Breathing  Post-op Assessment: Report given to RN, Post -op Vital signs reviewed and stable and Patient moving all extremities X 4  Post vital signs: Reviewed and stable  Last Vitals:  Vitals Value Taken Time  BP 120/72 01/06/22 1550  Temp    Pulse 108 01/06/22 1551  Resp 22 01/06/22 1551  SpO2 100 % 01/06/22 1551    Last Pain:  Vitals:   01/06/22 1420  TempSrc:   PainSc: 4          Complications: No notable events documented.

## 2022-01-06 NOTE — Progress Notes (Signed)
FHR dopplered at 131 bpm after lap cholecystectomy.  Pt still sleepy from anesthesia. Dr Mora Appl updated.  No further orders received

## 2022-01-06 NOTE — Anesthesia Procedure Notes (Signed)
Procedure Name: Intubation Date/Time: 01/06/2022 2:59 PM Performed by: Nils Pyle, CRNA Pre-anesthesia Checklist: Patient identified, Emergency Drugs available, Suction available and Patient being monitored Patient Re-evaluated:Patient Re-evaluated prior to induction Oxygen Delivery Method: Circle System Utilized Preoxygenation: Pre-oxygenation with 100% oxygen Induction Type: IV induction, Rapid sequence and Cricoid Pressure applied Laryngoscope Size: Miller and 2 Grade View: Grade I Tube type: Oral Tube size: 7.0 mm Number of attempts: 1 Airway Equipment and Method: Stylet and Oral airway Placement Confirmation: ETT inserted through vocal cords under direct vision, positive ETCO2 and breath sounds checked- equal and bilateral Secured at: 22 cm Tube secured with: Tape Dental Injury: Teeth and Oropharynx as per pre-operative assessment

## 2022-01-06 NOTE — Progress Notes (Signed)
Subjective/Chief Complaint: Ruq pain   Objective: Vital signs in last 24 hours: Temp:  [97.3 F (36.3 C)-98.3 F (36.8 C)] 97.8 F (36.6 C) (01/26 0626) Pulse Rate:  [84-100] 84 (01/26 0626) Resp:  [16-17] 17 (01/26 0457) BP: (89-110)/(50-57) 94/53 (01/26 0626) SpO2:  [98 %-100 %] 98 % (01/26 0457) Weight:  [52.3 kg] 52.3 kg (01/25 1415)    Intake/Output from previous day: 01/25 0701 - 01/26 0700 In: 930.4 [I.V.:930.4] Out: -  Intake/Output this shift: No intake/output data recorded.  GI: soft tender ruq gravid uterus  Lab Results:  Recent Labs    01/03/22 1822 01/05/22 1443  WBC 8.1 6.8  HGB 11.6* 11.8*  HCT 34.3* 34.2*  PLT 260 260   BMET Recent Labs    01/03/22 1822 01/05/22 1443  NA 136 132*  K 3.4* 3.9  CL 105 103  CO2 23 22  GLUCOSE 78 76  BUN 5* <5*  CREATININE 0.40* 0.50  CALCIUM 8.5* 8.7*   PT/INR No results for input(s): LABPROT, INR in the last 72 hours. ABG No results for input(s): PHART, HCO3 in the last 72 hours.  Invalid input(s): PCO2, PO2  Studies/Results: CT ABDOMEN PELVIS W CONTRAST  Result Date: 01/05/2022 CLINICAL DATA:  Nausea, vomiting, abdominal pain. EXAM: CT ABDOMEN AND PELVIS WITH CONTRAST TECHNIQUE: Multidetector CT imaging of the abdomen and pelvis was performed using the standard protocol following bolus administration of intravenous contrast. RADIATION DOSE REDUCTION: This exam was performed according to the departmental dose-optimization program which includes automated exposure control, adjustment of the mA and/or kV according to patient size and/or use of iterative reconstruction technique. CONTRAST:  49mL OMNIPAQUE IOHEXOL 300 MG/ML  SOLN COMPARISON:  Right upper quadrant ultrasound 01/03/2021 FINDINGS: Lower chest: No acute airspace disease or pleural effusion. Hepatobiliary: No focal liver abnormality. Gallstone is noted. No pericholecystic fat stranding or inflammation. No biliary dilatation. Pancreas:  Unremarkable. No pancreatic ductal dilatation or surrounding inflammatory changes. Spleen: Normal in size without focal abnormality. Adrenals/Urinary Tract: No adrenal nodule. Mild to moderate right and minimal left hydronephrosis. No renal calculi or perinephric edema. Unremarkable urinary bladder. No bladder wall thickening. Stomach/Bowel: Decompressed stomach. No small bowel obstruction or inflammation. Small to moderate colonic stool burden without colonic wall thickening or inflammatory change. The appendix is not confidently visualized. No pericecal inflammation or evidence of appendicitis. No abnormal rectal distention. Vascular/Lymphatic: Normal caliber abdominal aorta. No acute vascular findings. No abdominopelvic adenopathy. Reproductive: Gravid uterus. Fetus is in cephalic presentation. The ovaries are not discretely visualized, there is no suspicious scratch the left ovary is visualized and normal. The right ovary is not definitively seen. There is no suspicious adnexal mass. Other: No free air or free fluid.  No abdominopelvic collection. Musculoskeletal: There are no acute or suspicious osseous abnormalities. IMPRESSION: 1. Mild to moderate right and minimal left hydronephrosis without urolithiasis. This is likely secondary to pregnancy. 2. Gravid uterus. Fetus is in cephalic presentation. 3. Cholelithiasis without CT findings of acute cholecystitis. Electronically Signed   By: Narda Rutherford M.D.   On: 01/05/2022 17:47   MR ABDOMEN MRCP WO CONTRAST  Result Date: 01/06/2022 CLINICAL DATA:  31 year old female with history of acute severe pancreatitis. Worsening abdominal pain, nausea and vomiting. EXAM: MRI ABDOMEN WITHOUT CONTRAST  (INCLUDING MRCP) TECHNIQUE: Multiplanar multisequence MR imaging of the abdomen was performed. Heavily T2-weighted images of the biliary and pancreatic ducts were obtained, and three-dimensional MRCP images were rendered by post processing. COMPARISON:  No prior  abdominal MRI. CT the  abdomen and pelvis 01/05/2022. FINDINGS: Comment: Today's study is limited for detection and characterization of visceral and/or vascular lesions by lack of IV gadolinium. Portions of today's examination are also limited by considerable patient respiratory motion. Lower chest: Unremarkable. Hepatobiliary: No definite suspicious cystic or solid hepatic lesions are noted on today's noncontrast examination. No intra or extrahepatic biliary ductal dilatation noted on MRCP images. Common bile duct measures 4 mm in the porta hepatis. No filling defects in the common bile duct to suggest choledocholithiasis. 1.5 cm filling defect in the gallbladder, compatible with a gallstone. There is also some amorphous material lying dependently in the gallbladder which is mildly T1 hyperintense and T2 hypointense, compatible with biliary sludge. Gallbladder is normally distended. Gallbladder wall is normal in thickness. No pericholecystic fluid or surrounding inflammatory changes. Pancreas: No pancreatic mass identified on today's noncontrast examination. No pancreatic ductal dilatation noted on MRCP images. No peripancreatic fluid collections or inflammatory changes. Spleen:  Unremarkable. Adrenals/Urinary Tract: Bilateral kidneys and adrenal glands are normal in appearance. No hydroureteronephrosis in the visualized portions of the abdomen. Stomach/Bowel: Visualized portions are unremarkable. Vascular/Lymphatic: No aneurysm identified in the visualized abdominal vasculature on today's noncontrast study. No lymphadenopathy noted in the abdomen. Other: Gravid uterus with IUP partially imaged. No significant volume of ascites. Musculoskeletal: No aggressive appearing osseous lesions are noted in the visualized portions of the skeleton. IMPRESSION: 1. No imaging findings to suggest complicated pancreatitis at this time. 2. Cholelithiasis and biliary sludge in the gallbladder. No findings to suggest an acute  cholecystitis at this time. Additionally, there is no evidence of choledocholithiasis. Electronically Signed   By: Trudie Reed M.D.   On: 01/06/2022 05:29    Anti-infectives: Anti-infectives (From admission, onward)    None       Assessment/Plan: Cholecystitis -she does not have pancreatitis- elevation of lipase is not indicative of that -needs cholecystectomy due to continued symptoms and pain. -discussed risk to fetus, will plan lap chole today -I discussed the procedure in detail.  We discussed the risks and benefits of a laparoscopic cholecystectomy and possible cholangiogram including, but not limited to bleeding, infection, injury to surrounding structures such as the intestine or liver, bile leak, retained gallstones, need to convert to an open procedure, prolonged diarrhea, blood clots such as  DVT, common bile duct injury, anesthesia risks, and possible need for additional procedures.  The likelihood of improvement in symptoms and return to the patient's normal status is good. We discussed the typical post-operative recovery course.    Emelia Loron 01/06/2022

## 2022-01-06 NOTE — Progress Notes (Signed)
Name: Angela Russo Medical Record Number:  [id number] Date of Birth: 1991-02-05 Date of Service: 01/06/2022  31 y.o. [id number] [redacted]w[redacted]d HD#1 admitted for possible acute pancreatitis.  Pt currently stable with no complaints. She denies abdominal pain presently. She denies vaginal bleeding, no leaking of fluid. Reports FM.  The patient's past medical history and prenatal records were reviewed.  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    Physical Examination:   Vitals:   01/06/22 0626 01/06/22 0736  BP: (!) 94/53 (!) 99/56  Pulse: 84 91  Resp:  17  Temp: 97.8 F (36.6 C) 97.8 F (36.6 C)  SpO2:  100%   General appearance - alert, well appearing, and in no distress Mental status - alert, oriented to person, place, and time, normal mood, behavior, speech, dress, motor activity, and thought processes Lungs CTA Cor RRR Abd  Soft, gravid, nontender Ex SCDs FHT via doppler:  150's  Cervix: not evaluated  Results for orders placed or performed during the hospital encounter of 01/05/22 (from the past 24 hour(s))  CBC with Differential/Platelet     Status: Abnormal   Collection Time: 01/05/22  2:43 PM  Result Value Ref Range   WBC 6.8 4.0 - 10.5 K/uL   RBC 3.79 (L) 3.87 - 5.11 MIL/uL   Hemoglobin 11.8 (L) 12.0 - 15.0 g/dL   HCT 24.0 (L) 97.3 - 53.2 %   MCV 90.2 80.0 - 100.0 fL   MCH 31.1 26.0 - 34.0 pg   MCHC 34.5 30.0 - 36.0 g/dL   RDW 99.2 42.6 - 83.4 %   Platelets 260 150 - 400 K/uL   nRBC 0.0 0.0 - 0.2 %   Neutrophils Relative % 67 %   Neutro Abs 4.5 1.7 - 7.7 K/uL   Lymphocytes Relative 21 %   Lymphs Abs 1.4 0.7 - 4.0 K/uL   Monocytes Relative 11 %   Monocytes Absolute 0.7 0.1 - 1.0 K/uL   Eosinophils Relative 1 %   Eosinophils Absolute 0.1 0.0 - 0.5 K/uL   Basophils  Relative 0 %   Basophils Absolute 0.0 0.0 - 0.1 K/uL   Immature Granulocytes 0 %   Abs Immature Granulocytes 0.02 0.00 - 0.07 K/uL  Comprehensive metabolic panel     Status: Abnormal   Collection Time: 01/05/22  2:43 PM  Result Value Ref Range   Sodium 132 (L) 135 - 145 mmol/L   Potassium 3.9 3.5 - 5.1 mmol/L   Chloride 103 98 - 111 mmol/L   CO2 22 22 - 32 mmol/L   Glucose, Bld 76 70 - 99 mg/dL   BUN <5 (L) 6 - 20 mg/dL   Creatinine, Ser 1.96 0.44 - 1.00 mg/dL   Calcium 8.7 (L) 8.9 - 10.3 mg/dL   Total Protein 6.1 (L) 6.5 - 8.1 g/dL   Albumin 3.1 (L) 3.5 - 5.0 g/dL   AST 18 15 - 41 U/L   ALT 14 0 - 44 U/L   Alkaline Phosphatase 66 38 - 126 U/L   Total Bilirubin 0.6 0.3 - 1.2 mg/dL   GFR, Estimated >22 >29 mL/min   Anion gap 7 5 - 15  Amylase     Status: Abnormal   Collection Time: 01/05/22  2:43 PM  Result Value Ref Range   Amylase 192 (H) 28 - 100 U/L  Lipase, blood  Status: Abnormal   Collection Time: 01/05/22  2:43 PM  Result Value Ref Range   Lipase 81 (H) 11 - 51 U/L  Urinalysis, Routine w reflex microscopic Urine, Clean Catch     Status: Abnormal   Collection Time: 01/05/22  3:14 PM  Result Value Ref Range   Color, Urine YELLOW YELLOW   APPearance CLOUDY (A) CLEAR   Specific Gravity, Urine 1.012 1.005 - 1.030   pH 7.0 5.0 - 8.0   Glucose, UA NEGATIVE NEGATIVE mg/dL   Hgb urine dipstick NEGATIVE NEGATIVE   Bilirubin Urine NEGATIVE NEGATIVE   Ketones, ur 20 (A) NEGATIVE mg/dL   Protein, ur NEGATIVE NEGATIVE mg/dL   Nitrite NEGATIVE NEGATIVE   Leukocytes,Ua NEGATIVE NEGATIVE  Resp Panel by RT-PCR (Flu A&B, Covid) Nasopharyngeal Swab     Status: None   Collection Time: 01/05/22  6:54 PM   Specimen: Nasopharyngeal Swab; Nasopharyngeal(NP) swabs in vial transport medium  Result Value Ref Range   SARS Coronavirus 2 by RT PCR NEGATIVE NEGATIVE   Influenza A by PCR NEGATIVE NEGATIVE   Influenza B by PCR NEGATIVE NEGATIVE  Type and screen [facility]     Status: None   Collection Time: 01/05/22  7:45 PM  Result Value Ref Range   ABO/RH(D) B POS    Antibody Screen NEG    Sample Expiration      01/08/2022,2359 Performed at [facility], [address] [place], [place] [address]     Assessment: HD#1  [redacted]w[redacted]d with acute cholecystitis secondary to gallstones.  Discussed case with Dr. Dwain Sarna, most likely not pancreatitis.   Plan: Patient is on call to OR with General Surgery for laparoscopic cholecystectomy today. She is NPO Dopplers pre and post op IV hydration PRN pain medication post operatively Appreciate General surgery care  [facility]  01/06/2022

## 2022-01-07 ENCOUNTER — Encounter (HOSPITAL_COMMUNITY): Payer: Self-pay | Admitting: General Surgery

## 2022-01-07 DIAGNOSIS — Z9049 Acquired absence of other specified parts of digestive tract: Secondary | ICD-10-CM

## 2022-01-07 DIAGNOSIS — Z3492 Encounter for supervision of normal pregnancy, unspecified, second trimester: Secondary | ICD-10-CM

## 2022-01-07 MED ORDER — OXYCODONE HCL 5 MG PO TABS: 5.0000 mg | ORAL_TABLET | Freq: Three times a day (TID) | ORAL | 0 refills | Status: DC | PRN

## 2022-01-07 NOTE — Discharge Summary (Signed)
General Surgery OB Discharge Summary     Patient Name: Angela Russo DOB: 11-Oct-1991 MRN: [id number]  Date of admission: 01/05/2022 Delivering MD: This patient has no babies on file. Type of discharge: 01/07/2022  Admitting diagnosis: Cholecystolithiasis complicating pregnancy in second trimester Intrauterine pregnancy: [redacted]w[redacted]d     Secondary diagnosis:  Principal Problem:   Cholecystolithiasis complicating pregnancy in second trimester Active Problems:   S/P laparoscopic cholecystectomy   Pregnant and not yet delivered, second trimester                               History of Present Illness: Ms. Angela Russo is a 31 y.o. female, [id number], who presents at [redacted]w[redacted]d weeks gestation. 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 female G71P1021 @ [redacted]w[redacted]d here with worsening upper abdomina pain, nausea and vomiting. She was seen on 1/23 with similar symptoms and was diagnosed with cholelithiasis. She went home and attempted to eat rice, bread and yogurt; she vomited all of these things. She is able to keep down  fluids, however is not able to keep down solids. The pain in her abdomen has worsened since 1/23. The pain is in the epigastric/RUQ. The pain comes and goes. The pain worsens with vomiting and eating solids. Percocet helps some.    She called [facility] and they would not see her because they do not accept her insurance.  1/26: Indications:  31 year old female, who comes in with abdominal pain.  Patient is 20 weeks 6 days. Patient comes in with a 1 week history of abdominal pain she states that pain was epigastric and right upper quadrant. Patient has undergone CT and ultrasound.  This was significant for 1.2 cm gallstone, positive Murphy sign, however no gallbladder wall thickening.  CT scan today reveals cholelithiasis without signs of acute cholecystitis.  Patient did have an elevated lipase at 81, amylase of 192.  Patient with normal T bili at 0.6, normal LFTs. Pt was taken back to OR for lap chole with general surgery.   1/27 Today pt is POD#1 form lap chole, tolerated well, pain under control with po pain meds. Tolerating PO feeds, c/o gas, encourage to move, Pt stable to be discharged to home with f/u in 1-2 weeks with [facility].   Physical exam  Vitals:   01/06/22 1816 01/06/22 1941 01/06/22 2324 01/07/22 0330  BP: (!) 105/55 (!) 92/46 (!) 99/53 (!) 92/50  Pulse: 94 97 (!) 108 90  Resp: 17 16 16 18   Temp: 98.3 F (36.8 C) 98.5 F (36.9 C)  98.3 F (36.8 C) 97.7 F (36.5 C)  TempSrc: Oral Oral Oral Oral  SpO2: 97% 99% 98% 100%  Weight:      Height:       General appearance - alert, well appearing, and in no distress Mental status - alert, oriented to person, place, and time, normal mood, behavior, speech, dress, motor activity, and thought processes Lungs CTA Cor RRR Abd  Soft, gravid, slight tenderness on RUQ, but tolerated exam. Incision CDI.  Ex SCDs FHT via doppler:  145's  Labs: Lab Results  Component Value Date   WBC 6.8 01/05/2022   HGB 11.8 (L) 01/05/2022    HCT 34.2 (L) 01/05/2022   MCV 90.2 01/05/2022   PLT 260 01/05/2022   CMP Latest Ref Rng & Units 01/05/2022  Glucose 70 - 99 mg/dL 76  BUN 6 - 20 mg/dL <5(L)  Creatinine 0.44 - 1.00 mg/dL 0.50  Sodium 135 - 145 mmol/L 132(L)  Potassium 3.5 - 5.1 mmol/L 3.9  Chloride 98 - 111 mmol/L 103  CO2 22 - 32 mmol/L 22  Calcium 8.9 - 10.3 mg/dL 8.7(L)  Total Protein 6.5 - 8.1 g/dL 6.1(L)  Total Bilirubin 0.3 - 1.2 mg/dL 0.6  Alkaline Phos 38 - 126 U/L 66  AST 15 - 41 U/L 18  ALT 0 - 44 U/L 14    Date of discharge: 01/07/2022 Discharge Diagnoses:  S/P Lap Chole Discharge instruction: per After Visit Summary   After visit meds:   Activity:           unrestricted and pelvic rest Advance as tolerated. Pelvic rest for 6 weeks.  Diet:                routine Medications: PNV, Ibuprofen, and oxy IR, tylenol.  Condition:  Pt discharge to home in stable and condition   Meds: Allergies as of 01/07/2022   No Known Allergies      Medication List     TAKE these medications    acetaminophen 500 MG tablet Commonly known as: TYLENOL Take 1 tablet (500 mg total) by mouth every 6 (six) hours as needed. What changed: how much to take   aspirin EC 81 MG tablet Take 81 mg by mouth daily. Swallow whole.   cyclobenzaprine 10 MG tablet Commonly known as: FLEXERIL Take 1 tablet (10 mg total) by mouth 3 (three) times daily as needed for muscle spasms.   ibuprofen 600 MG tablet Commonly known as: ADVIL Take 600 mg by mouth every 6 (six) hours as needed.   metoCLOPramide 10 MG tablet Commonly known as: REGLAN Take 1 tablet (10 mg total) by mouth every 6 (six) hours.   multivitamin-prenatal 27-0.8 MG Tabs tablet Take 1 tablet by mouth daily at 12 noon.   ondansetron 8 MG disintegrating tablet Commonly known as: ZOFRAN-ODT Take 1 tablet (8 mg total) by mouth every 8 (eight) hours as needed for nausea or vomiting.   oxyCODONE 5 MG immediate release tablet Commonly known as: Roxicodone Take 1  tablet (5 mg total) by mouth every 8 (eight) hours as needed.   oxyCODONE-acetaminophen 5-325 MG tablet Commonly known as: PERCOCET/ROXICET Take 1 tablet by mouth every 6 (six) hours as needed for severe pain.        Discharge Follow Up:   Follow-up [facility]. Schedule an appointment as soon as possible for a visit in 1 week(s).   Specialty: Obstetrics and Gynecology Why: 1-2 weeks Contact information: [address]  [place] [phone] [place], NP-C, [place] 01/07/2022, 7:37 AM  Noralyn Pick, [place]

## 2022-01-07 NOTE — Progress Notes (Signed)
1 Day Post-Op  Subjective: CC: Doing well. Soreness around her incisions that she rates as a 2-3/10 and tolerable with pain meds. She feels a little bloated this morning but is passing flatus. Had some nausea with liquids yesterday and required zofran. Better this morning but still gets nausea on occasion. Has been able to tolerate liquids this am. Mobilizing. Voiding.   Objective: Vital signs in last 24 hours: Temp:  [97.3 F (36.3 C)-98.5 F (36.9 C)] 97.7 F (36.5 C) (01/27 0330) Pulse Rate:  [84-112] 90 (01/27 0330) Resp:  [16-29] 18 (01/27 0330) BP: (87-122)/(46-72) 92/50 (01/27 0330) SpO2:  [96 %-100 %] 100 % (01/27 0330)    Intake/Output from previous day: 01/26 0701 - 01/27 0700 In: 910 [P.O.:60; I.V.:800; IV Piggyback:50] Out: 10 [Blood:10] Intake/Output this shift: No intake/output data recorded.  PE: Gen:  Alert, NAD, pleasant Pulm: Normal rate and effort  Abd: Gravid abdomen that is very soft and approprirately tender around laparoscopic incisions. No peritonitis. +BS. Laparoscopic incisions with steri-strips in place, c/d/I.  Psych: A&Ox3  Skin: no rashes noted, warm and dry  Lab Results:  Recent Labs    01/05/22 1443  WBC 6.8  HGB 11.8*  HCT 34.2*  PLT 260   BMET Recent Labs    01/05/22 1443  NA 132*  K 3.9  CL 103  CO2 22  GLUCOSE 76  BUN <5*  CREATININE 0.50  CALCIUM 8.7*   PT/INR No results for input(s): LABPROT, INR in the last 72 hours. CMP     Component Value Date/Time   NA 132 (L) 01/05/2022 1443   K 3.9 01/05/2022 1443   CL 103 01/05/2022 1443   CO2 22 01/05/2022 1443   GLUCOSE 76 01/05/2022 1443   BUN <5 (L) 01/05/2022 1443   CREATININE 0.50 01/05/2022 1443   CALCIUM 8.7 (L) 01/05/2022 1443   PROT 6.1 (L) 01/05/2022 1443   ALBUMIN 3.1 (L) 01/05/2022 1443   AST 18 01/05/2022 1443   ALT 14 01/05/2022 1443   ALKPHOS 66 01/05/2022 1443   BILITOT 0.6 01/05/2022 1443   GFRNONAA >60 01/05/2022 1443   GFRAA >60 09/18/2019  0603   Lipase     Component Value Date/Time   LIPASE 81 (H) 01/05/2022 1443    Studies/Results: CT ABDOMEN PELVIS W CONTRAST  Result Date: 01/05/2022 CLINICAL DATA:  Nausea, vomiting, abdominal pain. EXAM: CT ABDOMEN AND PELVIS WITH CONTRAST TECHNIQUE: Multidetector CT imaging of the abdomen and pelvis was performed using the standard protocol following bolus administration of intravenous contrast. RADIATION DOSE REDUCTION: This exam was performed according to the departmental dose-optimization program which includes automated exposure control, adjustment of the mA and/or kV according to patient size and/or use of iterative reconstruction technique. CONTRAST:  28mL OMNIPAQUE IOHEXOL 300 MG/ML  SOLN COMPARISON:  Right upper quadrant ultrasound 01/03/2021 FINDINGS: Lower chest: No acute airspace disease or pleural effusion. Hepatobiliary: No focal liver abnormality. Gallstone is noted. No pericholecystic fat stranding or inflammation. No biliary dilatation. Pancreas: Unremarkable. No pancreatic ductal dilatation or surrounding inflammatory changes. Spleen: Normal in size without focal abnormality. Adrenals/Urinary Tract: No adrenal nodule. Mild to moderate right and minimal left hydronephrosis. No renal calculi or perinephric edema. Unremarkable urinary bladder. No bladder wall thickening. Stomach/Bowel: Decompressed stomach. No small bowel obstruction or inflammation. Small to moderate colonic stool burden without colonic wall thickening or inflammatory change. The appendix is not confidently visualized. No pericecal inflammation or evidence of appendicitis. No abnormal rectal distention. Vascular/Lymphatic: Normal caliber abdominal aorta.  No acute vascular findings. No abdominopelvic adenopathy. Reproductive: Gravid uterus. Fetus is in cephalic presentation. The ovaries are not discretely visualized, there is no suspicious scratch the left ovary is visualized and normal. The right ovary is not  definitively seen. There is no suspicious adnexal mass. Other: No free air or free fluid.  No abdominopelvic collection. Musculoskeletal: There are no acute or suspicious osseous abnormalities. IMPRESSION: 1. Mild to moderate right and minimal left hydronephrosis without urolithiasis. This is likely secondary to pregnancy. 2. Gravid uterus. Fetus is in cephalic presentation. 3. Cholelithiasis without CT findings of acute cholecystitis. Electronically Signed   By: Keith Rake M.D.   On: 01/05/2022 17:47   MR ABDOMEN MRCP WO CONTRAST  Result Date: 01/06/2022 CLINICAL DATA:  31 year old female with history of acute severe pancreatitis. Worsening abdominal pain, nausea and vomiting. EXAM: MRI ABDOMEN WITHOUT CONTRAST  (INCLUDING MRCP) TECHNIQUE: Multiplanar multisequence MR imaging of the abdomen was performed. Heavily T2-weighted images of the biliary and pancreatic ducts were obtained, and three-dimensional MRCP images were rendered by post processing. COMPARISON:  No prior abdominal MRI. CT the abdomen and pelvis 01/05/2022. FINDINGS: Comment: Today's study is limited for detection and characterization of visceral and/or vascular lesions by lack of IV gadolinium. Portions of today's examination are also limited by considerable patient respiratory motion. Lower chest: Unremarkable. Hepatobiliary: No definite suspicious cystic or solid hepatic lesions are noted on today's noncontrast examination. No intra or extrahepatic biliary ductal dilatation noted on MRCP images. Common bile duct measures 4 mm in the porta hepatis. No filling defects in the common bile duct to suggest choledocholithiasis. 1.5 cm filling defect in the gallbladder, compatible with a gallstone. There is also some amorphous material lying dependently in the gallbladder which is mildly T1 hyperintense and T2 hypointense, compatible with biliary sludge. Gallbladder is normally distended. Gallbladder wall is normal in thickness. No  pericholecystic fluid or surrounding inflammatory changes. Pancreas: No pancreatic mass identified on today's noncontrast examination. No pancreatic ductal dilatation noted on MRCP images. No peripancreatic fluid collections or inflammatory changes. Spleen:  Unremarkable. Adrenals/Urinary Tract: Bilateral kidneys and adrenal glands are normal in appearance. No hydroureteronephrosis in the visualized portions of the abdomen. Stomach/Bowel: Visualized portions are unremarkable. Vascular/Lymphatic: No aneurysm identified in the visualized abdominal vasculature on today's noncontrast study. No lymphadenopathy noted in the abdomen. Other: Gravid uterus with IUP partially imaged. No significant volume of ascites. Musculoskeletal: No aggressive appearing osseous lesions are noted in the visualized portions of the skeleton. IMPRESSION: 1. No imaging findings to suggest complicated pancreatitis at this time. 2. Cholelithiasis and biliary sludge in the gallbladder. No findings to suggest an acute cholecystitis at this time. Additionally, there is no evidence of choledocholithiasis. Electronically Signed   By: Vinnie Langton M.D.   On: 01/06/2022 05:29    Anti-infectives: Anti-infectives (From admission, onward)    Start     Dose/Rate Route Frequency Ordered Stop   01/06/22 1428  ceFAZolin (ANCEF) 2-4 GM/100ML-% IVPB       Note to Pharmacy: Renda Rolls L: cabinet override      01/06/22 1428 01/07/22 0244        Assessment/Plan POD 1 s/p Laparoscopic Cholecystectomy for Acute Cholecystitis on 01/06/2022 by Dr. Donne Hazel  [redacted]w[redacted]d Pregnant  - If patient tolerates diet, okay for discharge from our standpoint. It appears the discharge order is already in and pain medications have been sent by the primary team. We will arrange follow up in the office. Discharge and return precautions discussed.   FEN -  Reg VTE - SCDs, okay for chemical prophylaxis from a general surgery standpoint ID - Ancef periop .  This  care required moderate level of medical decision making.    LOS: 2 days    Jillyn Ledger , [facility] 01/07/2022, 7:44 AM Please see Amion for pager number during day hours 7:00am-4:30pm

## 2022-01-07 NOTE — Discharge Instructions (Signed)
[facility]  Please arrive at least 30 min before your appointment to complete your check in paperwork.  If you are unable to arrive 30 min prior to your appointment time we may have to cancel or reschedule you. LAPAROSCOPIC SURGERY: POST OP INSTRUCTIONS Always review your discharge instruction sheet given to you by the facility where your surgery was performed. IF YOU HAVE DISABILITY OR FAMILY LEAVE FORMS, YOU MUST BRING THEM TO THE OFFICE FOR PROCESSING.   DO NOT GIVE THEM TO YOUR DOCTOR.  PAIN CONTROL  First take acetaminophen (Tylenol).  Follow directions on package.  Taking acetaminophen (Tylenol) regularly after surgery will help to control your pain and lower the amount of prescription pain medication you may need.  You should not take more than 3,000 mg (3 grams) of acetaminophen (Tylenol) in 24 hours.  You should not take ibuprofen (Advil), aleve, motrin, naprosyn or other NSAIDS during pregnancy unless otherwise told by your OB.  A prescription for pain medication may be given to you upon discharge.  Take your pain medication as prescribed, if you still have uncontrolled pain after taking acetaminophen (Tylenol) Use ice packs to help control pain. If you need a refill on your pain medication, please contact your pharmacy.  They will contact our office to request authorization. Prescriptions will not be filled after 5pm or on week-ends.  HOME MEDICATIONS Take your usually prescribed medications unless otherwise directed.  DIET You should follow a light diet the first few days after arrival home.  Be sure to include lots of fluids daily. Avoid fatty, fried foods.   CONSTIPATION It is common to experience some constipation after surgery and if you are taking pain medication.  Increasing fluid intake and taking a stool softener (such as Colace) will usually help or prevent this problem from occurring.    WOUND/INCISION CARE Most patients will experience some  swelling and bruising in the area of the incisions.  Ice packs will help.  Swelling and bruising can take several days to resolve.  Unless discharge instructions indicate otherwise, follow guidelines below  STERI-STRIPS - you may remove your outer bandages 48 hours after surgery, and you may shower at that time.  You have steri-strips (small skin tapes) in place directly over the incision.  These strips should be left on the skin for 7-10 days.   DERMABOND/SKIN GLUE - you may shower in 24 hours.  The glue will flake off over the next 2-3 weeks. Any sutures or staples will be removed at the office during your follow-up visit.  ACTIVITIES You may resume regular (light) daily activities beginning the next day--such as daily self-care, walking, climbing stairs--gradually increasing activities as tolerated.  You may have sexual intercourse when it is comfortable.  Refrain from any heavy lifting or straining until approved by your doctor. You may drive when you are no longer taking prescription pain medication, you can comfortably wear a seatbelt, and you can safely maneuver your car and apply brakes.  FOLLOW-UP You should see your doctor in the office for a follow-up appointment approximately 2-3 weeks after your surgery.  You should have been given your post-op/follow-up appointment when your surgery was scheduled.  If you did not receive a post-op/follow-up appointment, make sure that you call for this appointment within a day or two after you arrive home to insure a convenient appointment time.   WHEN TO CALL YOUR DOCTOR: Fever over 101.0 Inability to urinate Continued bleeding from incision. Increased pain, redness, or  drainage from the incision. Increasing abdominal pain  The clinic staff is available to answer your questions during regular business hours.  Please dont hesitate to call and ask to speak to one of the nurses for clinical concerns.  If you have a medical emergency, go to the  nearest emergency room or call 911.  A surgeon from [facility] is always on call at the hospital. [address], [address], [place], [place]  [address] ? [address] [address], [place], [place]   [address] [phone] ? [phone] ? FAX [phone]

## 2022-01-07 NOTE — Plan of Care (Signed)
°  Problem: Education: Goal: Knowledge of disease or condition will improve Outcome: Adequate for Discharge Goal: Knowledge of the prescribed therapeutic regimen will improve Outcome: Adequate for Discharge Goal: Individualized Educational Video(s) Outcome: Adequate for Discharge   

## 2022-01-09 ENCOUNTER — Other Ambulatory Visit: Payer: Self-pay

## 2022-01-09 ENCOUNTER — Inpatient Hospital Stay (HOSPITAL_COMMUNITY): Payer: Medicaid Other

## 2022-01-09 ENCOUNTER — Encounter (HOSPITAL_COMMUNITY): Payer: Self-pay | Admitting: Obstetrics & Gynecology

## 2022-01-09 ENCOUNTER — Inpatient Hospital Stay (HOSPITAL_COMMUNITY)
Admission: AD | Admit: 2022-01-09 | Discharge: 2022-01-09 | Disposition: A | Discharge status: Home / Self Care | Payer: Medicaid Other | Attending: Obstetrics & Gynecology | Admitting: Obstetrics & Gynecology

## 2022-01-09 DIAGNOSIS — O212 Late vomiting of pregnancy: Secondary | ICD-10-CM | POA: Diagnosis not present

## 2022-01-09 DIAGNOSIS — Z3A21 21 weeks gestation of pregnancy: Secondary | ICD-10-CM | POA: Insufficient documentation

## 2022-01-09 DIAGNOSIS — K5900 Constipation, unspecified: Secondary | ICD-10-CM | POA: Insufficient documentation

## 2022-01-09 DIAGNOSIS — O99612 Diseases of the digestive system complicating pregnancy, second trimester: Secondary | ICD-10-CM | POA: Diagnosis not present

## 2022-01-09 DIAGNOSIS — R1011 Right upper quadrant pain: Secondary | ICD-10-CM | POA: Insufficient documentation

## 2022-01-09 DIAGNOSIS — O26892 Other specified pregnancy related conditions, second trimester: Secondary | ICD-10-CM | POA: Insufficient documentation

## 2022-01-09 DIAGNOSIS — R109 Unspecified abdominal pain: Secondary | ICD-10-CM | POA: Diagnosis not present

## 2022-01-09 LAB — C-REACTIVE PROTEIN: CRP: 3.5 mg/dL — ABNORMAL HIGH (ref <1.0)

## 2022-01-09 LAB — COMPREHENSIVE METABOLIC PANEL
ALT: 51 U/L — ABNORMAL HIGH (ref 0–44)
AST: 45 U/L — ABNORMAL HIGH (ref 15–41)
Albumin: 3 g/dL — ABNORMAL LOW (ref 3.5–5.0)
Alkaline Phosphatase: 74 U/L (ref 38–126)
Anion gap: 10 (ref 5–15)
BUN: <5 mg/dL — ABNORMAL LOW (ref 6–20)
CO2: 22 mmol/L (ref 22–32)
Calcium: 8.5 mg/dL — ABNORMAL LOW (ref 8.9–10.3)
Chloride: 104 mmol/L (ref 98–111)
Creatinine, Ser: 0.38 mg/dL — ABNORMAL LOW (ref 0.44–1.00)
GFR, Estimated: >60 mL/min (ref >60)
Glucose, Bld: 74 mg/dL (ref 70–99)
Potassium: 3.1 mmol/L — ABNORMAL LOW (ref 3.5–5.1)
Sodium: 136 mmol/L (ref 135–145)
Total Bilirubin: 0.4 mg/dL (ref 0.3–1.2)
Total Protein: 5.9 g/dL — ABNORMAL LOW (ref 6.5–8.1)

## 2022-01-09 LAB — URINALYSIS, ROUTINE W REFLEX MICROSCOPIC
Bilirubin Urine: NEGATIVE
Glucose, UA: NEGATIVE mg/dL
Hgb urine dipstick: NEGATIVE
Ketones, ur: 20 mg/dL — AB
Leukocytes,Ua: NEGATIVE
Nitrite: NEGATIVE
Protein, ur: NEGATIVE mg/dL
Specific Gravity, Urine: 1.003 — ABNORMAL LOW (ref 1.005–1.030)
pH: 7 (ref 5.0–8.0)

## 2022-01-09 LAB — CBC
HCT: 33.8 % — ABNORMAL LOW (ref 36.0–46.0)
Hemoglobin: 11.7 g/dL — ABNORMAL LOW (ref 12.0–15.0)
MCH: 31.5 pg (ref 26.0–34.0)
MCHC: 34.6 g/dL (ref 30.0–36.0)
MCV: 91.1 fL (ref 80.0–100.0)
Platelets: 247 10*3/uL (ref 150–400)
RBC: 3.71 MIL/uL — ABNORMAL LOW (ref 3.87–5.11)
RDW: 13.2 % (ref 11.5–15.5)
WBC: 7.4 10*3/uL (ref 4.0–10.5)
nRBC: 0 % (ref 0.0–0.2)

## 2022-01-09 LAB — AMYLASE: Amylase: 101 U/L — ABNORMAL HIGH (ref 28–100)

## 2022-01-09 LAB — LIPASE, BLOOD: Lipase: 25 U/L (ref 11–51)

## 2022-01-09 MED ORDER — FLEET ENEMA 7-19 GM/118ML RE ENEM
1.0000 | ENEMA | Freq: Once | RECTAL | Status: DC
Start: 2022-01-09 — End: 2022-01-09

## 2022-01-09 MED ORDER — ONDANSETRON 4 MG PO TBDP
8.0000 mg | ORAL_TABLET | Freq: Once | ORAL | Status: AC
Start: 2022-01-09 — End: 2022-01-09
  Administered 2022-01-09: 8 mg via ORAL
  Filled 2022-01-09: qty 2

## 2022-01-09 MED ORDER — LACTATED RINGERS IV BOLUS
1000.0000 mL | Freq: Once | INTRAVENOUS | Status: AC
  Administered 2022-01-09: 1000 mL via INTRAVENOUS

## 2022-01-09 NOTE — MAU Provider Note (Addendum)
Patient Angela Russo is a 31 y.o. G4P1021  At 31 y.o. here with complaints of nausea, vomiting and pain that started on Saturday. She is status post cholecystectomy on Thursday (4 days ago) and was discharged home on Friday. Since then she has taken 4 of her roxicodone and 3 percocet.   She denies contractions, vaginal bleeding, LOF or any concerns for baby. She denies fever, SOB.  She had not had prior vomiting in her pregnancy until she was diagnosed with acute cholecystis.   She started vomiting yesterday afternoon after she tried eating small food and liquids; she could not keep down liquids. She reports that she vomited 2 times overnight and at 7 am and 10:30 am; she has not tried to eat anything today.   She reports that her stomach is hurting "all over".  History     CSN: [id number]  Arrival date and time: 01/09/22 1225   Event Date/Time   First Provider Initiated Contact with Patient 01/09/22 1350      Chief Complaint  Patient presents with   Abdominal Pain   Emesis   Nausea   Abdominal Pain This is a new problem. The current episode started yesterday. The pain is located in the generalized abdominal region. The pain is at a severity of 5/10. The quality of the pain is a sensation of fullness. The abdominal pain does not radiate. Associated symptoms include constipation, nausea and vomiting.  She reports that she threw up 2 times today and 2-3 times overnight.  OB History     Gravida  4   Para  1   Term  1   Preterm      AB  2   Living  1      SAB  2   IAB  0   Ectopic      Multiple  0   Live Births  1        Obstetric Comments  Pre-e  2020         Past Medical History:  Diagnosis Date   Gallstones    Headache    Pregnancy induced hypertension     Past Surgical History:  Procedure Laterality Date   APPENDECTOMY     CHOLECYSTECTOMY N/A 01/06/2022   Procedure: LAPAROSCOPIC CHOLECYSTECTOMY;  Surgeon: Rolm Bookbinder, MD;  Location: [place];  Service: General;  Laterality: N/A;    Family History  Problem Relation Age of Onset   Cancer Mother        leukemia   Diabetes Father     Social History   Tobacco Use   Smoking status: Never   Smokeless tobacco: Never  Vaping Use   Vaping Use: Never used  Substance Use Topics   Alcohol use: Never   Drug use: Never    Allergies: No Known Allergies  Medications Prior to Admission  Medication Sig Dispense Refill Last Dose   aspirin EC 81 MG tablet Take 81 mg by mouth daily. Swallow whole.   01/09/2022   oxyCODONE (ROXICODONE) 5 MG immediate release tablet Take 1 tablet (5 mg total) by mouth every 8 (eight) hours as needed. 10 tablet 0 01/09/2022   Prenatal Vit-Fe Fumarate-FA (MULTIVITAMIN-PRENATAL) 27-0.8 MG TABS tablet Take 1 tablet by mouth daily at 12 noon.   01/09/2022   acetaminophen (TYLENOL) 500 MG tablet Take 1 tablet (500 mg total) by mouth every 6 (six) hours as needed. (Patient taking differently: Take 1,000 mg by mouth every 6 (six) hours as needed.) 30 tablet  0    cyclobenzaprine (FLEXERIL) 10 MG tablet Take 1 tablet (10 mg total) by mouth 3 (three) times daily as needed for muscle spasms. 30 tablet 0    ibuprofen (ADVIL) 600 MG tablet Take 600 mg by mouth every 6 (six) hours as needed.      metoCLOPramide (REGLAN) 10 MG tablet Take 1 tablet (10 mg total) by mouth every 6 (six) hours. 30 tablet 0    ondansetron (ZOFRAN-ODT) 8 MG disintegrating tablet Take 1 tablet (8 mg total) by mouth every 8 (eight) hours as needed for nausea or vomiting. 20 tablet 0    oxyCODONE-acetaminophen (PERCOCET/ROXICET) 5-325 MG tablet Take 1 tablet by mouth every 6 (six) hours as needed for severe pain. 6 tablet 0     Review of Systems  Constitutional: Negative.   HENT: Negative.    Respiratory: Negative.    Cardiovascular: Negative.   Gastrointestinal:  Positive for abdominal pain, constipation, nausea and vomiting.  Genitourinary: Negative.   Neurological: Negative.    Hematological: Negative.   Psychiatric/Behavioral: Negative.    Physical Exam   Blood pressure 100/61, pulse 95, temperature 98.1 F (36.7 C), temperature source Oral, resp. rate 16, height 5\' 2"  (1.575 m), weight 51.8 kg, last menstrual period 08/12/2021, SpO2 100 %, unknown if currently breastfeeding.  Physical Exam Constitutional:      Appearance: She is well-developed.  Abdominal:     General: Abdomen is flat. Bowel sounds are normal. There is no distension or abdominal bruit.     Palpations: Abdomen is soft.     Tenderness: There is no abdominal tenderness.     Comments: Surgical incision sites are covered with steritape and appear well-approximated, non-tender, no signs of infection  Neurological:     Mental Status: She is alert.    MAU Course  Procedures  MDM -Dr. Nelda Marseille aware of patient; will draw labs and consider  Gen Sur after labs are back -will start IV and give enema> patient could not tolerate Soap suds enema and did not have success having a BM -bowel sounds are normal and she is passing flatus  -normal labs, no signs of acute pancreatitis -RUQ and KUB normal, no signs of ileus; large stool burden -patient has had no vomiting while in MAU; she has rested and slept Assessment and Plan   1. Constipation, unspecified constipation type   2. Abdominal pain   3. [redacted] weeks gestation of pregnancy   -keep appt on Feb 3 -long discussion with patient about her constipation and how to manage and bowel clean out; patient and husband took pictures of the products recommended on the [facility] website of fiber, enema, colace and Miralax. Reviewed when patient should come to the MAU; recommended tapering off of pain meds and switch antiemetics due to fact that they may be constipating her.  -patient and husband verbalized understanding [place] 01/09/2022, 1:59 PM

## 2022-01-09 NOTE — MAU Note (Signed)
Pt reports to mau with c/o upper abd pain, nausea, vomiting for the past few days after having her gallbladder removed on Thursday.  Pt also reports constipation for the past 7 days.  States she has been taking colace since Friday.  Denies vag bleeding. FHR 151

## 2022-01-09 NOTE — Discharge Instructions (Addendum)
You have constipation which is hard stools that are difficult to pass. It is important to have regular bowel movements every 1-3 days that are soft and easy to pass. Hard stools increase your risk of hemorrhoids and are very uncomfortable.   To prevent constipation you can increase the amount of fiber in your diet. Examples of foods with fiber are leafy greens, whole grain breads, oatmeal and other grains.  It is also important to drink at least eight 8oz glass of water everyday.   If you have not has a bowel movement in 4-5 days you made need to clean out your bowel.  This will have establish normal movement through your bowel.    Miralax Clean out Take 8 capfuls of miralax in 64 oz of gatorade. You can use any fluid that appeals to you (gatorade, water, juice) Continue to drink at least eight 8 oz glasses of water throughout the day You can repeat with another 8 capfuls of miralax in 64 oz of gatorade if you are not having a large amount of stools You will need to be at home and close to a bathroom for about 8 hours when you do the above as you may need to go to the bathroom frequently.   After you are cleaned out: - Start Colace100mg  twice daily - Start Miralax once daily - Start a daily fiber supplement like metamucil or citrucel - You can safely use enemas in pregnancy  - if you are having diarrhea you can reduce to Colace once a day or miralax every other day or a 1/2 capful daily.    -At [facility], YOU NEED TO BUY -2 64 ounces of gatorade/juice -1 bottle of Miralax -box of enema -Fiber supplement    TO PREVENT CONSTIPATION, try not to take a lot of the oxycodone medications, or the Zofran (odansetron). If you can take reglan (metoclopramide), that would be better.

## 2022-01-10 LAB — SURGICAL PATHOLOGY

## 2022-03-06 ENCOUNTER — Encounter (HOSPITAL_COMMUNITY): Payer: Self-pay | Admitting: Obstetrics and Gynecology

## 2022-03-06 ENCOUNTER — Inpatient Hospital Stay (HOSPITAL_COMMUNITY)
Admission: AD | Admit: 2022-03-06 | Discharge: 2022-03-06 | Disposition: A | Discharge status: Home / Self Care | Payer: Medicaid Other | Attending: Obstetrics and Gynecology | Admitting: Obstetrics and Gynecology

## 2022-03-06 ENCOUNTER — Other Ambulatory Visit: Payer: Self-pay

## 2022-03-06 DIAGNOSIS — R1012 Left upper quadrant pain: Secondary | ICD-10-CM | POA: Diagnosis not present

## 2022-03-06 DIAGNOSIS — R1031 Right lower quadrant pain: Secondary | ICD-10-CM | POA: Diagnosis not present

## 2022-03-06 DIAGNOSIS — Z3492 Encounter for supervision of normal pregnancy, unspecified, second trimester: Secondary | ICD-10-CM

## 2022-03-06 DIAGNOSIS — R110 Nausea: Secondary | ICD-10-CM | POA: Diagnosis not present

## 2022-03-06 DIAGNOSIS — R102 Pelvic and perineal pain: Secondary | ICD-10-CM | POA: Diagnosis not present

## 2022-03-06 DIAGNOSIS — Z3689 Encounter for other specified antenatal screening: Secondary | ICD-10-CM | POA: Insufficient documentation

## 2022-03-06 DIAGNOSIS — R531 Weakness: Secondary | ICD-10-CM | POA: Insufficient documentation

## 2022-03-06 DIAGNOSIS — O26893 Other specified pregnancy related conditions, third trimester: Secondary | ICD-10-CM | POA: Diagnosis not present

## 2022-03-06 DIAGNOSIS — R1032 Left lower quadrant pain: Secondary | ICD-10-CM | POA: Diagnosis not present

## 2022-03-06 DIAGNOSIS — Z79899 Other long term (current) drug therapy: Secondary | ICD-10-CM | POA: Diagnosis not present

## 2022-03-06 DIAGNOSIS — Z3A29 29 weeks gestation of pregnancy: Secondary | ICD-10-CM | POA: Insufficient documentation

## 2022-03-06 DIAGNOSIS — M545 Low back pain, unspecified: Secondary | ICD-10-CM | POA: Insufficient documentation

## 2022-03-06 DIAGNOSIS — M79604 Pain in right leg: Secondary | ICD-10-CM | POA: Diagnosis not present

## 2022-03-06 DIAGNOSIS — R519 Headache, unspecified: Secondary | ICD-10-CM | POA: Insufficient documentation

## 2022-03-06 DIAGNOSIS — Z9049 Acquired absence of other specified parts of digestive tract: Secondary | ICD-10-CM

## 2022-03-06 LAB — URINALYSIS, ROUTINE W REFLEX MICROSCOPIC
Bilirubin Urine: NEGATIVE
Glucose, UA: NEGATIVE mg/dL
Hgb urine dipstick: NEGATIVE
Ketones, ur: NEGATIVE mg/dL
Leukocytes,Ua: NEGATIVE
Nitrite: NEGATIVE
Protein, ur: NEGATIVE mg/dL
Specific Gravity, Urine: 1.005 (ref 1.005–1.030)
pH: 7 (ref 5.0–8.0)

## 2022-03-06 MED ORDER — MISC. DEVICES MISC: 0 refills | Status: DC

## 2022-03-06 MED ORDER — CYCLOBENZAPRINE HCL 5 MG PO TABS
5.0000 mg | ORAL_TABLET | Freq: Once | ORAL | Status: AC
  Administered 2022-03-06: 5 mg via ORAL
  Filled 2022-03-06: qty 1

## 2022-03-06 MED ORDER — CYCLOBENZAPRINE HCL 10 MG PO TABS: 10.0000 mg | ORAL_TABLET | Freq: Three times a day (TID) | ORAL | 4 refills | Status: AC | PRN

## 2022-03-06 MED ORDER — ACETAMINOPHEN 500 MG PO TABS
1000.0000 mg | ORAL_TABLET | Freq: Once | ORAL | Status: AC
  Administered 2022-03-06: 1000 mg via ORAL
  Filled 2022-03-06: qty 2

## 2022-03-06 NOTE — MAU Note (Signed)
Angela Russo is a 31 y.o. at [redacted]w[redacted]d here in MAU reporting: pain in entire right leg that began this morning.  States pain is with movement.  Also reports H/A and upper abdominal/epigastric area.  Reports took Tylenol @ 1400 but no relief noted. ? ?Onset of complaint: today ?Pain score: 5/10 leg pain, 8/10 H/A, 5/10 epigastric ?Vitals:  ? 03/06/22 1814  ?BP: (!) 91/57  ?Pulse: (!) 115  ?Resp: 19  ?Temp: 98.1 ?F (36.7 ?C)  ?SpO2: 100%  ?   ?FHT: 158bpm w/ +FM.  Denies VB or LOF. ?Lab orders placed from triage:    ?

## 2022-03-06 NOTE — MAU Provider Note (Addendum)
?History  ?  ?[id number] ? ?Arrival date and time: 03/06/22 1707 ? ?Chief Complaint  ?Patient presents with  ? Leg Pain  ? ?HPI ?Angela Russo is a 31 y.o. at [redacted]w[redacted]d who presents to MAU with right leg pain/weakness.  ? ?Patient reports that she has been having right leg pain/weakness since this morning. Describes the pain as sharp. It is located in the right groin area. She also states that her right leg feels heavy, so much so that it is hard to move. She reports that she is able to walk but she has to move very slowly. She has not had symptoms like this before. She reports having some lower back pain and pubic pain, but she was told that this was normal in pregnancy. She denies any pain in her spine. She denies any recent new activities or exercise that could have exacerbated these symptoms.  ? ?She also reports that she is having a headache. Hx of chronic headaches for which she was taking Ibuprofen until 28 weeks. This worked well for her but now she is having more difficulty controlled her headaches. Tylenol dose not seem to help much. She last took a dose earlier today. ? ?Additionally, she has been having intermittent left upper abdominal pain for the last week or so. It comes and goes. She describes it as sharp when it bothers her. It goes away on its own without any additional interventions. She reports occasional contractions but nothing severe. She reports nausea, which she has had throughout pregnancy which is controlled with her nausea medications. She denies fevers, chills, vomiting, vaginal bleeding, vaginal discharge, or dysuria.  ? ?Vaginal bleeding: No ?LOF: No ?Fetal Movement: Yes ?Contractions: Occasional, none currently  ? ?--/--/B POS (01/25 1947) ? ?OB History   ? ? Gravida  ?4  ? Para  ?1  ? Term  ?1  ? Preterm  ?   ? AB  ?2  ? Living  ?1  ?  ? ? SAB  ?2  ? IAB  ?0  ? Ectopic  ?   ? Multiple  ?0  ? Live Births  ?1  ?   ?  ? Obstetric Comments  ?Pre-e  2020  ?  ? ?  ? ? ?Past Medical History:   ?Diagnosis Date  ? Gallstones   ? Headache   ? Pregnancy induced hypertension   ? ? ?Past Surgical History:  ?Procedure Laterality Date  ? APPENDECTOMY    ? CHOLECYSTECTOMY N/A 01/06/2022  ? Procedure: LAPAROSCOPIC CHOLECYSTECTOMY;  Surgeon: Rolm Bookbinder, MD;  Location: [facility];  Service: General;  Laterality: N/A;  ? ? ?Family History  ?Problem Relation Age of Onset  ? Cancer Mother   ?     leukemia  ? Diabetes Father   ? ? ?Social History  ? ?Socioeconomic History  ? Marital status: Married  ?  Spouse name: Not on file  ? Number of children: Not on file  ? Years of education: Not on file  ? Highest education level: Not on file  ?Occupational History  ? Not on file  ?Tobacco Use  ? Smoking status: Never  ? Smokeless tobacco: Never  ?Vaping Use  ? Vaping Use: Never used  ?Substance and Sexual Activity  ? Alcohol use: Never  ? Drug use: Never  ? Sexual activity: Not Currently  ?  Birth control/protection: None  ?Other Topics Concern  ? Not on file  ?Social History Narrative  ? Not on file  ? ?Social Determinants  of Health  ? ?Financial Resource Strain: Not on file  ?Food Insecurity: Not on file  ?Transportation Needs: Not on file  ?Physical Activity: Not on file  ?Stress: Not on file  ?Social Connections: Not on file  ?Intimate Partner Violence: Not on file  ? ? ?No Known Allergies ? ?No current facility-administered medications on file prior to encounter.  ? ?Current Outpatient Medications on File Prior to Encounter  ?Medication Sig Dispense Refill  ? acetaminophen (TYLENOL) 500 MG tablet Take 1 tablet (500 mg total) by mouth every 6 (six) hours as needed. (Patient taking differently: Take 1,000 mg by mouth every 6 (six) hours as needed.) 30 tablet 0  ? aspirin EC 81 MG tablet Take 81 mg by mouth daily. Swallow whole.    ? cyclobenzaprine (FLEXERIL) 10 MG tablet Take 1 tablet (10 mg total) by mouth 3 (three) times daily as needed for muscle spasms. 30 tablet 0  ? ibuprofen (ADVIL) 600 MG tablet Take 600 mg by  mouth every 6 (six) hours as needed.    ? metoCLOPramide (REGLAN) 10 MG tablet Take 1 tablet (10 mg total) by mouth every 6 (six) hours. 30 tablet 0  ? ondansetron (ZOFRAN-ODT) 8 MG disintegrating tablet Take 1 tablet (8 mg total) by mouth every 8 (eight) hours as needed for nausea or vomiting. 20 tablet 0  ? oxyCODONE (ROXICODONE) 5 MG immediate release tablet Take 1 tablet (5 mg total) by mouth every 8 (eight) hours as needed. 10 tablet 0  ? oxyCODONE-acetaminophen (PERCOCET/ROXICET) 5-325 MG tablet Take 1 tablet by mouth every 6 (six) hours as needed for severe pain. 6 tablet 0  ? Prenatal Vit-Fe Fumarate-FA (MULTIVITAMIN-PRENATAL) 27-0.8 MG TABS tablet Take 1 tablet by mouth daily at 12 noon.    ? ? ?ROS ?Pertinent positives and negative per HPI, all others reviewed and negative. ? ?Physical Exam  ? ?BP (!) 91/57 (BP Location: Right Arm)   Pulse (!) 115   Temp 98.1 ?F (36.7 ?C)   Resp 19   Ht 5' (1.524 m)   Wt 54.7 kg   LMP 08/12/2021   SpO2 100%   BMI 23.57 kg/m?  ? ?Physical Exam ?Constitutional:   ?   General: She is not in acute distress. ?   Appearance: Normal appearance.  ?HENT:  ?   Head: Normocephalic and atraumatic.  ?   Mouth/Throat:  ?   Mouth: Mucous membranes are moist.  ?Eyes:  ?   Extraocular Movements: Extraocular movements intact.  ?   Conjunctiva/sclera: Conjunctivae normal.  ?Cardiovascular:  ?   Rate and Rhythm: Normal rate.  ?Pulmonary:  ?   Effort: Pulmonary effort is normal.  ?Abdominal:  ?   Palpations: Abdomen is soft.  ?   Tenderness: There is no right CVA tenderness, left CVA tenderness, guarding or rebound.  ?   Comments: Tender to palpation over bilateral groin regions, over pubic symphysis, and over suprapubic region, also mildly tender over LUQ  ?Musculoskeletal:     ?   General: Normal range of motion.  ?   Right lower leg: No edema.  ?   Left lower leg: No edema.  ?   Comments: Mild tenderness to palpation over bilateral lumbar paraspinal musculature, no midline  tenderness over spine  ?Skin: ?   General: Skin is warm and dry.  ?Neurological:  ?   Mental Status: She is alert and oriented to person, place, and time.  ?   Cranial Nerves: No cranial nerve deficit.  ?  Sensory: No sensory deficit.  ?   Comments: Strength 5/5 in lower extremities bilaterally; however, unable to sustain resistance with right plantar flexion, right dorsiflexion, and right hip flexion  ?Psychiatric:     ?   Mood and Affect: Mood normal.     ?   Behavior: Behavior normal.  ? ?FHT ?Baseline 150 bpm, moderate variability, + accels, no decels ?Toco: Uterine irritability, occasional contractions  ?Cat: 1, reactive ? ?Labs ?Results for orders placed or performed during the hospital encounter of 03/06/22 (from the past 24 hour(s))  ?Urinalysis, Routine w reflex microscopic Urine, Clean Catch     Status: None  ? Collection Time: 03/06/22  6:34 PM  ?Result Value Ref Range  ? Color, Urine YELLOW YELLOW  ? APPearance CLEAR CLEAR  ? Specific Gravity, Urine 1.005 1.005 - 1.030  ? pH 7.0 5.0 - 8.0  ? Glucose, UA NEGATIVE NEGATIVE mg/dL  ? Hgb urine dipstick NEGATIVE NEGATIVE  ? Bilirubin Urine NEGATIVE NEGATIVE  ? Ketones, ur NEGATIVE NEGATIVE mg/dL  ? Protein, ur NEGATIVE NEGATIVE mg/dL  ? Nitrite NEGATIVE NEGATIVE  ? Leukocytes,Ua NEGATIVE NEGATIVE  ? ?Imaging ?No results found. ? ?MAU Course  ?Procedures ?Lab Orders    ?     Urinalysis, Routine w reflex microscopic Urine, Clean Catch    ?Meds ordered this encounter  ?Medications  ? cyclobenzaprine (FLEXERIL) tablet 5 mg  ? acetaminophen (TYLENOL) tablet 1,000 mg  ? ?Imaging Orders  ?No imaging studies ordered today  ? ?MDM ?Patient presents to MAU with multiple complaints as above. Upon arrival, VSS, no acute distress. ? ?Right leg pain: 1 day in duration, located in groin, feels heavy with subjective weakness. Neurologically intact, though unable to perform sustained resistance with RLE movements. No LE edema, redness, or warmth to suggest DVT. Question  whether right groin pain is limiting patient's effort and thus affecting her ability to resist with movements. No midline tenderness over spine or other focal deficits to suggest more central problem in the spine. Will trial

## 2022-04-22 LAB — OB RESULTS CONSOLE GBS: GBS: NEGATIVE

## 2022-05-10 ENCOUNTER — Other Ambulatory Visit: Payer: Self-pay | Admitting: Obstetrics & Gynecology

## 2022-05-11 ENCOUNTER — Telehealth (HOSPITAL_COMMUNITY): Payer: Self-pay | Admitting: *Deleted

## 2022-05-11 ENCOUNTER — Encounter (HOSPITAL_COMMUNITY): Payer: Self-pay | Admitting: *Deleted

## 2022-05-11 NOTE — Telephone Encounter (Signed)
Preadmission screen  

## 2022-05-12 ENCOUNTER — Inpatient Hospital Stay (HOSPITAL_COMMUNITY): Payer: Medicaid Other | Admitting: Anesthesiology

## 2022-05-12 ENCOUNTER — Other Ambulatory Visit: Payer: Self-pay

## 2022-05-12 ENCOUNTER — Inpatient Hospital Stay (HOSPITAL_COMMUNITY)
Admission: RE | Admit: 2022-05-12 | Discharge: 2022-05-13 | DRG: 807 | Disposition: A | Discharge status: Home / Self Care | Payer: Medicaid Other | Attending: Obstetrics & Gynecology | Admitting: Obstetrics & Gynecology

## 2022-05-12 ENCOUNTER — Encounter (HOSPITAL_COMMUNITY): Payer: Self-pay | Admitting: Obstetrics & Gynecology

## 2022-05-12 ENCOUNTER — Inpatient Hospital Stay (HOSPITAL_COMMUNITY): Payer: Medicaid Other

## 2022-05-12 DIAGNOSIS — Z9049 Acquired absence of other specified parts of digestive tract: Secondary | ICD-10-CM | POA: Diagnosis not present

## 2022-05-12 DIAGNOSIS — O1214 Gestational proteinuria, complicating childbirth: Principal | ICD-10-CM | POA: Diagnosis present

## 2022-05-12 DIAGNOSIS — O3663X0 Maternal care for excessive fetal growth, third trimester, not applicable or unspecified: Secondary | ICD-10-CM | POA: Diagnosis present

## 2022-05-12 DIAGNOSIS — O4593 Premature separation of placenta, unspecified, third trimester: Secondary | ICD-10-CM | POA: Diagnosis present

## 2022-05-12 DIAGNOSIS — Z3A39 39 weeks gestation of pregnancy: Secondary | ICD-10-CM | POA: Diagnosis not present

## 2022-05-12 DIAGNOSIS — Z87448 Personal history of other diseases of urinary system: Principal | ICD-10-CM | POA: Diagnosis present

## 2022-05-12 LAB — CBC
HCT: 39.5 % (ref 36.0–46.0)
Hemoglobin: 14.2 g/dL (ref 12.0–15.0)
MCH: 32.8 pg (ref 26.0–34.0)
MCHC: 35.9 g/dL (ref 30.0–36.0)
MCV: 91.2 fL (ref 80.0–100.0)
Platelets: 193 10*3/uL (ref 150–400)
RBC: 4.33 MIL/uL (ref 3.87–5.11)
RDW: 13.2 % (ref 11.5–15.5)
WBC: 7.2 10*3/uL (ref 4.0–10.5)
nRBC: 0 % (ref 0.0–0.2)

## 2022-05-12 LAB — TYPE AND SCREEN
ABO/RH(D): B POS
Antibody Screen: NEGATIVE

## 2022-05-12 MED ORDER — LACTATED RINGERS IV SOLN
500.0000 mL | Freq: Once | INTRAVENOUS | Status: AC
  Administered 2022-05-12: 500 mL via INTRAVENOUS

## 2022-05-12 MED ORDER — ONDANSETRON HCL 4 MG/2ML IJ SOLN: 4.0000 mg | INTRAMUSCULAR | Status: DC | PRN

## 2022-05-12 MED ORDER — LIDOCAINE HCL (PF) 1 % IJ SOLN
INTRAMUSCULAR | Status: DC | PRN
  Administered 2022-05-12: 3 mL via EPIDURAL

## 2022-05-12 MED ORDER — SIMETHICONE 80 MG PO CHEW: 80.0000 mg | CHEWABLE_TABLET | ORAL | Status: DC | PRN

## 2022-05-12 MED ORDER — DIBUCAINE (PERIANAL) 1 % EX OINT: 1.0000 "application " | TOPICAL_OINTMENT | CUTANEOUS | Status: DC | PRN

## 2022-05-12 MED ORDER — PRENATAL MULTIVITAMIN CH
1.0000 | ORAL_TABLET | Freq: Every day | ORAL | Status: DC
  Administered 2022-05-13: 1 via ORAL
  Filled 2022-05-12: qty 1

## 2022-05-12 MED ORDER — PHENYLEPHRINE 80 MCG/ML (10ML) SYRINGE FOR IV PUSH (FOR BLOOD PRESSURE SUPPORT): 80.0000 ug | PREFILLED_SYRINGE | INTRAVENOUS | Status: DC | PRN

## 2022-05-12 MED ORDER — EPHEDRINE 5 MG/ML INJ: 10.0000 mg | INTRAVENOUS | Status: DC | PRN

## 2022-05-12 MED ORDER — OXYCODONE-ACETAMINOPHEN 5-325 MG PO TABS: 1.0000 | ORAL_TABLET | ORAL | Status: DC | PRN

## 2022-05-12 MED ORDER — DIPHENHYDRAMINE HCL 50 MG/ML IJ SOLN: 12.5000 mg | INTRAMUSCULAR | Status: DC | PRN

## 2022-05-12 MED ORDER — DIPHENHYDRAMINE HCL 25 MG PO CAPS: 25.0000 mg | ORAL_CAPSULE | Freq: Four times a day (QID) | ORAL | Status: DC | PRN

## 2022-05-12 MED ORDER — ONDANSETRON HCL 4 MG PO TABS: 4.0000 mg | ORAL_TABLET | ORAL | Status: DC | PRN

## 2022-05-12 MED ORDER — ZOLPIDEM TARTRATE 5 MG PO TABS: 5.0000 mg | ORAL_TABLET | Freq: Every evening | ORAL | Status: DC | PRN

## 2022-05-12 MED ORDER — OXYTOCIN BOLUS FROM INFUSION
333.0000 mL | Freq: Once | INTRAVENOUS | Status: AC
  Administered 2022-05-12: 333 mL via INTRAVENOUS

## 2022-05-12 MED ORDER — OXYCODONE-ACETAMINOPHEN 5-325 MG PO TABS: 2.0000 | ORAL_TABLET | ORAL | Status: DC | PRN

## 2022-05-12 MED ORDER — IBUPROFEN 600 MG PO TABS
600.0000 mg | ORAL_TABLET | Freq: Four times a day (QID) | ORAL | Status: DC
  Administered 2022-05-12 – 2022-05-13 (×4): 600 mg via ORAL
  Filled 2022-05-12 (×4): qty 1

## 2022-05-12 MED ORDER — COCONUT OIL OIL: 1.0000 "application " | TOPICAL_OIL | Status: DC | PRN

## 2022-05-12 MED ORDER — OXYTOCIN-SODIUM CHLORIDE 30-0.9 UT/500ML-% IV SOLN: 2.5000 [IU]/h | INTRAVENOUS | Status: DC

## 2022-05-12 MED ORDER — TETANUS-DIPHTH-ACELL PERTUSSIS 5-2.5-18.5 LF-MCG/0.5 IM SUSY: 0.5000 mL | PREFILLED_SYRINGE | Freq: Once | INTRAMUSCULAR | Status: DC

## 2022-05-12 MED ORDER — PHENYLEPHRINE 80 MCG/ML (10ML) SYRINGE FOR IV PUSH (FOR BLOOD PRESSURE SUPPORT)
80.0000 ug | PREFILLED_SYRINGE | INTRAVENOUS | Status: DC | PRN
  Filled 2022-05-12: qty 10

## 2022-05-12 MED ORDER — OXYTOCIN-SODIUM CHLORIDE 30-0.9 UT/500ML-% IV SOLN
1.0000 m[IU]/min | INTRAVENOUS | Status: DC
  Administered 2022-05-12: 2 m[IU]/min via INTRAVENOUS
  Filled 2022-05-12: qty 500

## 2022-05-12 MED ORDER — SENNOSIDES-DOCUSATE SODIUM 8.6-50 MG PO TABS
2.0000 | ORAL_TABLET | Freq: Every day | ORAL | Status: DC
  Administered 2022-05-13: 2 via ORAL
  Filled 2022-05-12: qty 2

## 2022-05-12 MED ORDER — BENZOCAINE-MENTHOL 20-0.5 % EX AERO
1.0000 "application " | INHALATION_SPRAY | CUTANEOUS | Status: DC | PRN
  Administered 2022-05-12: 1 via TOPICAL
  Filled 2022-05-12: qty 56

## 2022-05-12 MED ORDER — TRANEXAMIC ACID-NACL 1000-0.7 MG/100ML-% IV SOLN
INTRAVENOUS | Status: AC
  Administered 2022-05-12: 1000 mg
  Filled 2022-05-12: qty 100

## 2022-05-12 MED ORDER — LACTATED RINGERS IV SOLN: INTRAVENOUS | Status: DC

## 2022-05-12 MED ORDER — WITCH HAZEL-GLYCERIN EX PADS: 1.0000 "application " | MEDICATED_PAD | CUTANEOUS | Status: DC | PRN

## 2022-05-12 MED ORDER — ACETAMINOPHEN 325 MG PO TABS
650.0000 mg | ORAL_TABLET | ORAL | Status: DC | PRN
  Administered 2022-05-12 – 2022-05-13 (×2): 650 mg via ORAL
  Filled 2022-05-12 (×2): qty 2

## 2022-05-12 MED ORDER — FENTANYL CITRATE (PF) 100 MCG/2ML IJ SOLN
100.0000 ug | INTRAMUSCULAR | Status: DC | PRN
  Filled 2022-05-12: qty 2

## 2022-05-12 MED ORDER — SOD CITRATE-CITRIC ACID 500-334 MG/5ML PO SOLN: 30.0000 mL | ORAL | Status: DC | PRN

## 2022-05-12 MED ORDER — LIDOCAINE HCL (PF) 1 % IJ SOLN: 30.0000 mL | INTRAMUSCULAR | Status: DC | PRN

## 2022-05-12 MED ORDER — TERBUTALINE SULFATE 1 MG/ML IJ SOLN: 0.2500 mg | Freq: Once | INTRAMUSCULAR | Status: DC | PRN

## 2022-05-12 MED ORDER — ACETAMINOPHEN 325 MG PO TABS: 650.0000 mg | ORAL_TABLET | ORAL | Status: DC | PRN

## 2022-05-12 MED ORDER — LIDOCAINE-EPINEPHRINE (PF) 2 %-1:200000 IJ SOLN
INTRAMUSCULAR | Status: DC | PRN
  Administered 2022-05-12: 5 mL via EPIDURAL

## 2022-05-12 MED ORDER — ONDANSETRON HCL 4 MG/2ML IJ SOLN
4.0000 mg | Freq: Four times a day (QID) | INTRAMUSCULAR | Status: DC | PRN
Start: 2022-05-12 — End: 2022-05-12
  Administered 2022-05-12 (×2): 4 mg via INTRAVENOUS
  Filled 2022-05-12 (×2): qty 2

## 2022-05-12 MED ORDER — FENTANYL-BUPIVACAINE-NACL 0.5-0.125-0.9 MG/250ML-% EP SOLN
12.0000 mL/h | EPIDURAL | Status: DC | PRN
  Administered 2022-05-12: 12 mL/h via EPIDURAL
  Filled 2022-05-12: qty 250

## 2022-05-12 MED ORDER — LACTATED RINGERS IV SOLN: 500.0000 mL | INTRAVENOUS | Status: DC | PRN

## 2022-05-12 NOTE — Lactation Note (Signed)
This note was copied from a baby's chart. Lactation Consultation Note  Patient Name: Angela Russo [id number] Date: 05/12/2022 Reason for consult: Initial assessment;Mother's request;Difficult latch;Term;Breastfeeding assistance Age:33 hours  Mom stated latch pinching, with changes made pain resolved.  Mom stated she did not have milk and with hand expression was excited to see colostrum, with breast compression infant increase in depth of swallows.   Plan 1. To feed based on cues 8-12x 24hr period. Mom to offer breasts and look for signs of milk transfer.  2. Mom to supplement 5-7 ml after breastfeeding with pace bottle feeding and slow flow nipple.   Mom prefer to start pumping in am with use of the manual pump. Mom does not have a electric pump at home.  All questions answered at the end of the visit.   Maternal Data Has patient been taught Hand Expression?: Yes Does the patient have breastfeeding experience prior to this delivery?: Yes How long did the patient breastfeed?: 1 year and 8 months  Feeding Mother's Current Feeding Choice: Breast Milk and Formula  LATCH Score Latch: Repeated attempts needed to sustain latch, nipple held in mouth throughout feeding, stimulation needed to elicit sucking reflex.  Audible Swallowing: Spontaneous and intermittent  Type of Nipple: Everted at rest and after stimulation  Comfort (Breast/Nipple): Soft / non-tender  Hold (Positioning): Assistance needed to correctly position infant at breast and maintain latch.  LATCH Score: 8   Lactation Tools Discussed/Used    Interventions Interventions: Breast feeding basics reviewed;Assisted with latch;Skin to skin;Breast massage;Hand express;Breast compression;Adjust position;Support pillows;Expressed milk;Education;LC Psychologist, educational;Visual merchandiser education  Discharge WIC Program: Yes  Consult Status Consult Status: Follow-up Date: 05/13/22 Follow-up type:  In-patient    Sparsh Callens  Nicholson[place]Springer 05/12/2022, 7:06 PM

## 2022-05-12 NOTE — Anesthesia Preprocedure Evaluation (Signed)

## 2022-05-12 NOTE — Lactation Note (Signed)
This note was copied from a baby's chart. Lactation Consultation Note  Patient Name: Girl Leyda Vanderwerf [id number] Date: 05/12/2022 Reason for consult: L&D Initial assessment Age:33 hours  P2, Baby latched upon entering with lips flanged. Lactation to follow up on MBU.   LATCH Score Latch: Grasps breast easily, tongue down, lips flanged, rhythmical sucking.  Audible Swallowing: Spontaneous and intermittent  Type of Nipple: Everted at rest and after stimulation  Comfort (Breast/Nipple): Soft / non-tender  Hold (Positioning): Assistance needed to correctly position infant at breast and maintain latch.  LATCH Score: 9  Interventions Interventions: Education  Consult Status Consult Status: Follow-up from L&D    Dahlia Byes [facility] 05/12/2022, 1:29 PM

## 2022-05-13 LAB — COMPREHENSIVE METABOLIC PANEL
ALT: 21 U/L (ref 0–44)
AST: 29 U/L (ref 15–41)
Albumin: 2.4 g/dL — ABNORMAL LOW (ref 3.5–5.0)
Alkaline Phosphatase: 202 U/L — ABNORMAL HIGH (ref 38–126)
Anion gap: 5 (ref 5–15)
BUN: 5 mg/dL — ABNORMAL LOW (ref 6–20)
CO2: 23 mmol/L (ref 22–32)
Calcium: 8.5 mg/dL — ABNORMAL LOW (ref 8.9–10.3)
Chloride: 108 mmol/L (ref 98–111)
Creatinine, Ser: 0.51 mg/dL (ref 0.44–1.00)
GFR, Estimated: >60 mL/min (ref >60)
Glucose, Bld: 105 mg/dL — ABNORMAL HIGH (ref 70–99)
Potassium: 3.7 mmol/L (ref 3.5–5.1)
Sodium: 136 mmol/L (ref 135–145)
Total Bilirubin: 0.5 mg/dL (ref 0.3–1.2)
Total Protein: 5.2 g/dL — ABNORMAL LOW (ref 6.5–8.1)

## 2022-05-13 LAB — CBC
HCT: 36.4 % (ref 36.0–46.0)
Hemoglobin: 13 g/dL (ref 12.0–15.0)
MCH: 32.7 pg (ref 26.0–34.0)
MCHC: 35.7 g/dL (ref 30.0–36.0)
MCV: 91.7 fL (ref 80.0–100.0)
Platelets: 167 10*3/uL (ref 150–400)
RBC: 3.97 MIL/uL (ref 3.87–5.11)
RDW: 13 % (ref 11.5–15.5)
WBC: 10.3 10*3/uL (ref 4.0–10.5)
nRBC: 0 % (ref 0.0–0.2)

## 2022-05-13 LAB — RPR: RPR Ser Ql: NONREACTIVE

## 2022-05-13 MED ORDER — IBUPROFEN 600 MG PO TABS: 600.0000 mg | ORAL_TABLET | Freq: Four times a day (QID) | ORAL | 0 refills | Status: DC

## 2022-05-13 MED ORDER — ACETAMINOPHEN 325 MG PO TABS: 650.0000 mg | ORAL_TABLET | ORAL | Status: DC | PRN

## 2022-05-13 NOTE — H&P (Signed)
OB ADMISSION HISTORY & PHYSICAL-  MD Note very late--was present in hospital during her admission, labor and ultimately delivery.   Admission Date: 05/12/2022  6:35 AM  Admit Diagnosis: Gestational proteinuria without diagnosis of preeclampsia  Angela Russo is a 31 y.o. female [id number] [redacted]w[redacted]d admitted for induction of labor at term for  Proteinuria and h/o headaches, high risk for pre E. Patient had pre E previous Preg.  Endorses active FM, denies LOF and has had mild spotting and irregular ctx.    History of current pregnancy: [id number]   Prenatal Care with: [facility] Patient entered prenatal care at 9 wks.   EDC 05/19/22 by LMP 08/12/21 and congruent w/ 9 wk U/S.   Anatomy scan:  20 wks, complete w/ posterior placenta.   Antenatal testing: BPP done once in April for decreased fetal movement  Significant prenatal problems: 1.Gestational proteinuria  2.Migraines 3. Cholecystitis 2o to gallstones - had cholecystectomy done 1/23 during this gestation 4.LGA ?  5. H/o two second trimester miscarriages - one singleton pregnancy at 19 weeks & 18 wk twins 6. Antenatal vaginal bleeding (both in first and sec trimesters)   Prenatal Labs: ABO, Rh: --/--/B POS (06/01 2831) Antibody: NEG (06/01 0711) Rubella: Immune (10/31 0000)  RPR: NON REACTIVE (06/01 0711)  HBsAg: Negative (10/31 0000)  HIV: Non-reactive (10/31 0000)  1 HR GCT: PASS GBS: Negative/-- (05/12 0000)  GC/CHL: Negative  Genetics: Low risk female - Panorama Vaccines ALL UTD Tdap: [x]  Flu: [x]  Covid: [x]   Prenatal Transfer Tool  Maternal Diabetes: No Genetic Screening: Normal Maternal Ultrasounds/Referrals: Normal Fetal Ultrasounds or other Referrals:  Other:   Serial growth for LGA and h/o bleeding Maternal Substance Abuse:  No Significant Maternal Medications:  Meds include: Other:   Fioricet Significant Maternal Lab Results:  Group B Strep negative Other Comments:  None  OB History  Gravida Para Term Preterm AB Living   4 2 2   2 2   SAB IAB Ectopic Multiple Live Births  2 0   0 2    # Outcome Date GA Lbr Len/2nd Weight Sex Delivery Anes PTL Lv  4 Term 05/12/22 [redacted]w[redacted]d  2835 g F Vag-Spont EPI  LIV  3 Term 09/17/19 [redacted]w[redacted]d 10:45 / 02:08 2835 g F Vag-Spont EPI  LIV  2 SAB 2018          1 SAB 2018            Obstetric Comments  Pre-e  2020    Medical / Surgical History: Past medical history:  Past Medical History:  Diagnosis Date   Gallstones    Headache    Pregnancy induced hypertension     Past surgical history:  Past Surgical History:  Procedure Laterality Date   APPENDECTOMY     CHOLECYSTECTOMY N/A 01/06/2022   Procedure: LAPAROSCOPIC CHOLECYSTECTOMY;  Surgeon: 2019, MD;  Location: [facility];  Service: General;  Laterality: N/A;   Family History:  Family History  Problem Relation Age of Onset   Cancer Mother        leukemia   Diabetes Father     Social History:  reports that she has never smoked. She has never used smokeless tobacco. She reports that she does not drink alcohol and does not use drugs.  Allergies: Patient has no known allergies.   Current Medications at time of admission:  Prior to Admission medications   Medication Sig Start Date End Date Taking? Authorizing Provider  acetaminophen (TYLENOL) 325 MG tablet Take 2 tablets (  650 mg total) by mouth every 4 (four) hours as needed (for pain scale < 4  OR  temperature  >/=  100.5 F). 05/13/22   Roma Schanz, CNM  acetaminophen (TYLENOL) 500 MG tablet Take 1 tablet (500 mg total) by mouth every 6 (six) hours as needed. Patient taking differently: Take 1,000 mg by mouth every 6 (six) hours as needed. 10/17/21   Rolm Bookbinder, CNM  aspirin EC 81 MG tablet Take 81 mg by mouth daily. Swallow whole.    [provider]  ibuprofen (ADVIL) 600 MG tablet Take 1 tablet (600 mg total) by mouth every 6 (six) hours. 05/13/22   Roma Schanz, CNM  metoCLOPramide (REGLAN) 10 MG tablet Take 1 tablet (10 mg total) by mouth  every 6 (six) hours. 01/03/22   Marylene Land, CNM  Misc. Devices MISC Dispense one maternity belt for patient 03/06/22   Calvert Cantor, CNM  ondansetron (ZOFRAN-ODT) 8 MG disintegrating tablet Take 1 tablet (8 mg total) by mouth every 8 (eight) hours as needed for nausea or vomiting. 01/03/22   Marylene Land, CNM  oxyCODONE (ROXICODONE) 5 MG immediate release tablet Take 1 tablet (5 mg total) by mouth every 8 (eight) hours as needed. 01/07/22 01/07/23  Dale Astoria, FNP  Prenatal Vit-Fe Fumarate-FA (MULTIVITAMIN-PRENATAL) 27-0.8 MG TABS tablet Take 1 tablet by mouth daily at 12 noon.    [provider]    Review of Systems: Constitutional: Negative   HENT: Negative   Eyes: Negative   Respiratory: Negative   Cardiovascular: Negative   Gastrointestinal: Negative  Genitourinary: pos for bloody show, neg for LOF   Musculoskeletal: Negative   Skin: Negative   Neurological: Negative   Endo/Heme/Allergies: Negative   Psychiatric/Behavioral: Negative    Physical Exam (done 05/12/22)  Vitals: 05/12/22 at 0701:     BP 114/73     Pulse Rate 79     Resp 16     Temp 98.5 F (36.9 C)     Temp Source Oral     SpO2 100 %     Weight 125 lb (56.7 kg)     Height 5' (1.524 m)     Pain Score 5   General:AAO x3, no signs of distress Cardiovascular: RRR Respiratory: Lung fields clear to ausculation GU/GI: Abdomen gravid, non-tender, non-distended, active FM, vertex, EFW 6 # per Leopold's Extremities: No edema, negative for pain, tenderness, and cords Neuro: no clonus, normal DTRs.  Cervical exam:Dilation: 2-3 Effacement (%): 70 Station: -3 Exam by::RN  Tacy Dura FHR: baseline rate 13-140 / variability moderate / accelerations present 15x15 / no decelerations TOCO: irregular / occasional  Most recent growth ultrasound 04/08/22 office: EFW 5#7oz 75th percentile vtx, nml AFI       Assessment: 31 y.o. [id number] [redacted]w[redacted]d admitted for induction of labor  for gestational proteinuria at term; high risk secondary to h/o migraines, surgery performed during this gestation & previous h/o Pre E  Early / active 1st stage of labor FHR category 1 GBS neg Pain management plan: Epidural   Plan:  Admit to Labor and Delivery Routine admission orders Epidural on maternal demand IV hydration Continuous monitoring Pitocin for labor augmentation  Anticipate svd delivery   Essie Hart MD 05/13/2022 3:06 PM

## 2022-05-13 NOTE — Anesthesia Postprocedure Evaluation (Signed)
Anesthesia Post Note  Patient: Angela Russo  Procedure(s) Performed: AN AD HOC LABOR EPIDURAL     Patient location during evaluation: Mother Baby Anesthesia Type: Epidural Level of consciousness: awake and alert Pain management: pain level controlled Vital Signs Assessment: post-procedure vital signs reviewed and stable Respiratory status: spontaneous breathing, nonlabored ventilation and respiratory function stable Cardiovascular status: stable Postop Assessment: no headache, no backache, epidural receding, no apparent nausea or vomiting, patient able to bend at knees, adequate PO intake and able to ambulate Anesthetic complications: no   No notable events documented.  Last Vitals:  Vitals:   05/13/22 0050 05/13/22 0541  BP: 92/60 110/71  Pulse: 73 81  Resp: 18 18  Temp: 36.7 C (!) 36.4 C  SpO2:      Last Pain:  Vitals:   05/13/22 0800  TempSrc:   PainSc: 0-No pain   Pain Goal:                   [employer]

## 2022-05-13 NOTE — Discharge Summary (Incomplete)
Postpartum Discharge Summary  Date of Service updated target organ damage    Patient Name: Angela Russo DOB: [birth year] MRN: [id number]  Date of admission: 05/12/2022 Delivery date:05/12/2022  Delivering provider: Sanjuana Kava  Date of discharge: 05/13/2022  Admitting diagnosis: History of proteinuria syndrome [Z87.448] Intrauterine pregnancy: [redacted]w[redacted]d    Secondary diagnosis:  Principal Problem:   Normal labor Active Problems:   History of proteinuria syndrome  Additional problems:  Patient Active Problem List   Diagnosis Date Noted   History of proteinuria syndrome 05/12/2022   Normal labor 05/12/2022   S/P laparoscopic cholecystectomy 01/07/2022   Cholecystolithiasis complicating pregnancy in second trimester 01/06/2022   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       Discharge diagnosis: Term Pregnancy Delivered                                              Post partum procedures: none Augmentation: AROM and Pitocin Complications: Placental Abruption  Hospital course: {Courses:23701}  Magnesium Sulfate received: No BMZ received: No Rhophylac:N/A MMR:N/A Transfusion:{Transfusion received:30440034}  Physical exam  Vitals:   05/12/22 1555 05/12/22 2008 05/13/22 0050 05/13/22 0541  BP: 107/73 115/81 92/60 110/71  Pulse: 77 68 73 81  Resp: _0 Temp: (!) 97.5 F (36.4 C) 98 F (36.7 C) 98 F (36.7 C) (!) 97.5 F (36.4 C)  TempSrc: Oral  Oral Oral  SpO2: 100%     Weight:      Height:       General: alert, cooperative, and no distress Lochia: appropriate Uterine Fundus: firm Incision: N/A DVT Evaluation: No evidence of DVT seen on physical exam. Negative Homan's sign. No cords or calf tenderness. No significant calf/ankle edema. Labs: Lab Results  Component Value Date   WBC 10.3 05/13/2022   HGB 13.0 05/13/2022   HCT 36.4 05/13/2022   MCV 91.7 05/13/2022   PLT 167 05/13/2022      Latest Ref Rng & Units 05/13/2022    4:42 AM  CMP  Glucose 70 - 99  mg/dL 105    BUN 6 - 20 mg/dL 5    Creatinine 0.44 - 1.00 mg/dL 0.51    Sodium 135 - 145 mmol/L 136    Potassium 3.5 - 5.1 mmol/L 3.7    Chloride 98 - 111 mmol/L 108    CO2 22 - 32 mmol/L 23    Calcium 8.9 - 10.3 mg/dL 8.5    Total Protein 6.5 - 8.1 g/dL 5.2    Total Bilirubin 0.3 - 1.2 mg/dL 0.5    Alkaline Phos 38 - 126 U/L 202    AST 15 - 41 U/L 29    ALT 0 - 44 U/L 21     Edinburgh Score:    05/12/2022    3:00 PM  Edinburgh Postnatal Depression Scale Screening Tool  I have been able to laugh and see the funny side of things. 0  I have looked forward with enjoyment to things. 0  I have blamed myself unnecessarily when things went wrong. 0  I have been anxious or worried for no good reason. 0  I have felt scared or panicky for no good reason. 0  Things have been getting on top of me. 1  I have been so unhappy that I have had difficulty sleeping. 0  I have felt sad or miserable. 0  I  have been so unhappy that I have been crying. 0  The thought of harming myself has occurred to me. 0  Edinburgh Postnatal Depression Scale Total 1      After visit meds:  Allergies as of 05/13/2022   No Known Allergies      Medication List     STOP taking these medications    aspirin EC 81 MG tablet   metoCLOPramide 10 MG tablet Commonly known as: REGLAN   Misc. Devices Misc   ondansetron 8 MG disintegrating tablet Commonly known as: ZOFRAN-ODT   oxyCODONE 5 MG immediate release tablet Commonly known as: Roxicodone       TAKE these medications    acetaminophen 325 MG tablet Commonly known as: TYLENOL Take 2 tablets (650 mg total) by mouth every 4 (four) hours as needed (for pain scale < 4  OR  temperature  >/=  100.5 F). What changed:  medication strength how much to take when to take this reasons to take this   ibuprofen 600 MG tablet Commonly known as: ADVIL Take 1 tablet (600 mg total) by mouth every 6 (six) hours.   multivitamin-prenatal 27-0.8 MG Tabs  tablet Take 1 tablet by mouth daily at 12 noon.         Discharge home in stable condition Infant Feeding: Bottle and Breast Infant Disposition:home with mother Discharge instruction: per After Visit Summary and Postpartum booklet. Activity: Advance as tolerated. Pelvic rest for 6 weeks.  Diet: routine diet Anticipated Birth Control:  plans Nexplanon Postpartum Appointment:6 weeks Additional Postpartum F/U:  none Future Appointments:No future appointments. Follow up Visit:  [facility]. Go in 6 week(s).   Specialty: Obstetrics and Gynecology Contact information: [address] [place] [address] [phone]                    05/13/2022 Arrie Eastern, CNM

## 2022-05-16 LAB — SURGICAL PATHOLOGY

## 2022-05-23 ENCOUNTER — Telehealth (HOSPITAL_COMMUNITY): Payer: Self-pay | Admitting: *Deleted

## 2022-05-23 NOTE — Telephone Encounter (Signed)
Left phone voicemail message.  Duffy Rhody, RN 05-23-2022 at 2:35pm

## 2022-12-16 IMAGING — MR MR MRCP
12 of 17 series · 40 of 48 positions shown · non-contrast
Comparison: No prior abdominal MRI. CT the abdomen and pelvis
01/05/2022.

CLINICAL DATA: 30-year-old female with history of acute severe
pancreatitis. Worsening abdominal pain, nausea and vomiting.

EXAM:
MRI ABDOMEN WITHOUT CONTRAST  (INCLUDING MRCP)
TECHNIQUE: Multiplanar multisequence MR imaging of the abdomen was performed.
Heavily T2-weighted images of the biliary and pancreatic ducts were
obtained, and three-dimensional MRCP images were rendered by post
processing.

[Series 4: ax haste · axial · 6.0mm · 1.06mm/px · z∈[-102,+179]mm · 2 of 40 slices shown]
[im 1/40]
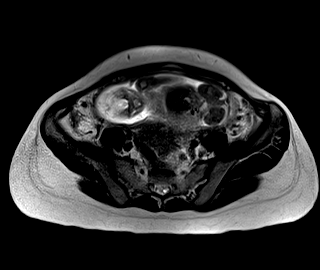
[im 40/40]
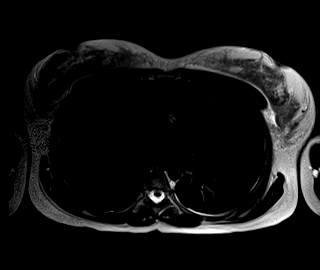

[Series 6: cor haste · coronal · 6.0mm · 1.25mm/px · 2 of 31 slices shown]
[im 1/31]
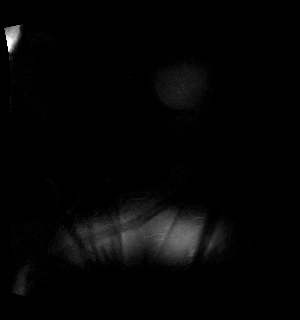
[im 31/31]
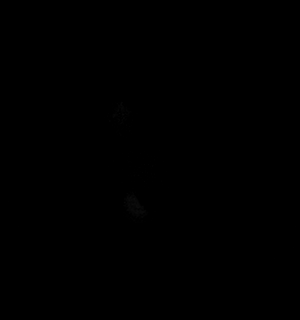

[Series 9: T2 fat-sat · axial · 6.0mm · 1.06mm/px · z∈[-79,+173]mm · 2 of 36 slices shown]
[im 1/36]
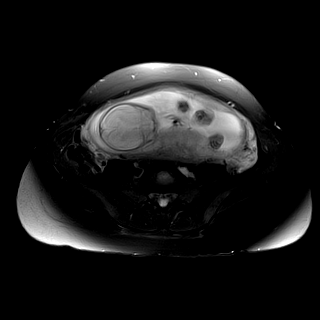
[im 36/36]
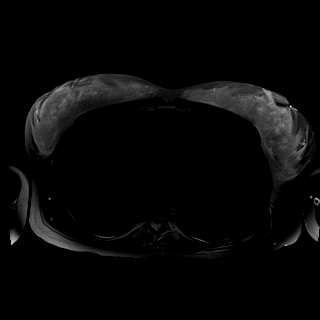

[Series 10: DWI · axial · 6.0mm · 1.27mm/px · z∈[-71,+174]mm · 6 of 105 slices shown (1 of 2)]
[im 1/105]
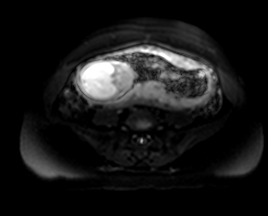
[im 21/105]
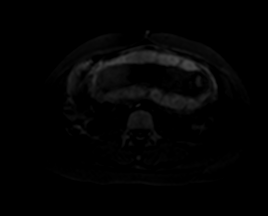
[im 42/105]
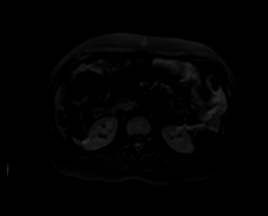
[im 63/105]
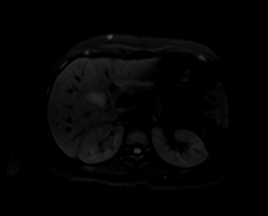
[im 84/105]
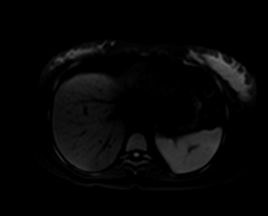
[im 105/105]
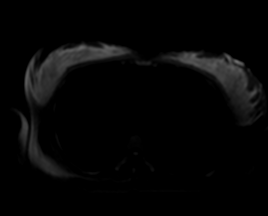

[Series 11: DWI · axial · 6.0mm · 1.27mm/px · z∈[-71,+174]mm · 2 of 35 slices shown (2 of 2)]
[im 1/35]
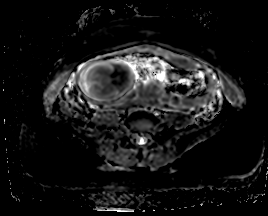
[im 35/35]
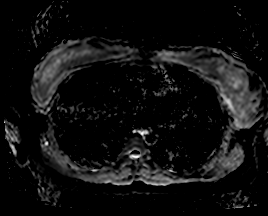

[Series 12: ax in and · axial · 3.0mm · 1.12mm/px · z∈[-50,+187]mm · 5 of 80 slices shown (1 of 4)]
[im 1/80]
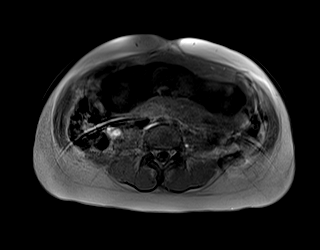
[im 20/80]
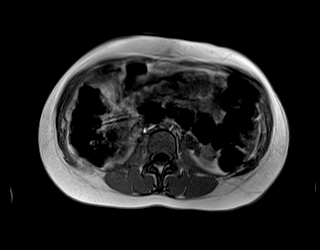
[im 40/80]
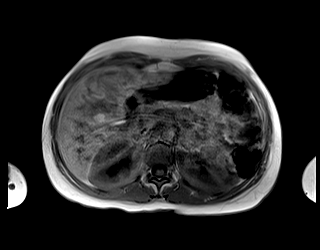
[im 60/80]
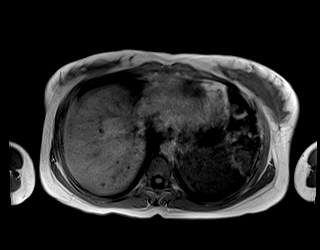
[im 80/80]
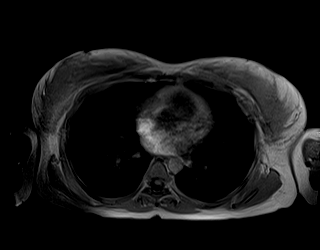

[Series 12: ax in and · axial · 3.0mm · 1.12mm/px · z∈[-50,+187]mm · 5 of 80 slices shown (2 of 4)]
[im 1/80]
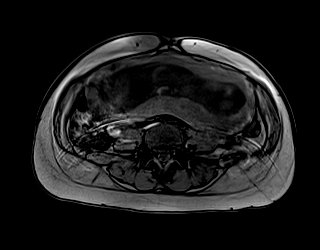
[im 20/80]
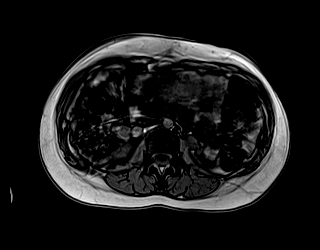
[im 40/80]
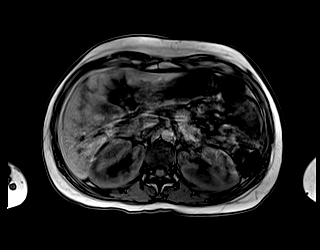
[im 60/80]
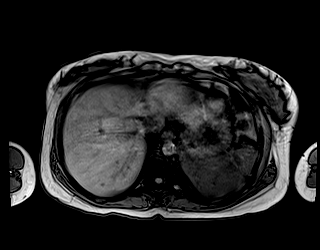
[im 80/80]
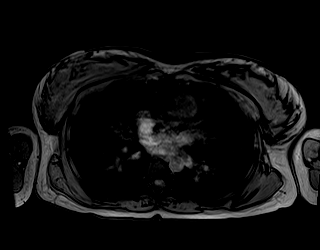

[Series 17: ax in and · axial · 3.0mm · 1.12mm/px · z∈[-50,+187]mm · 5 of 80 slices shown (3 of 4)]
[im 1/80]
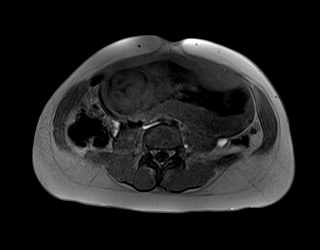
[im 20/80]
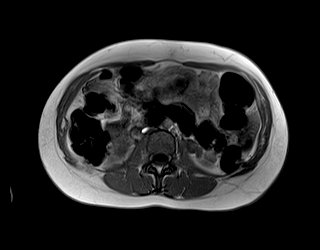
[im 40/80]
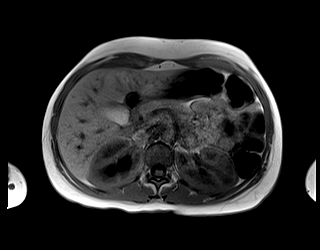
[im 60/80]
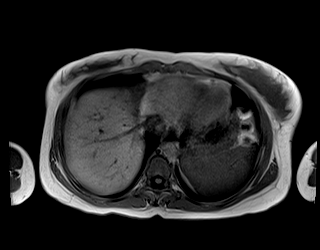
[im 80/80]
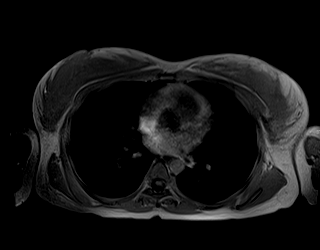

[Series 17: ax in and · axial · 3.0mm · 1.12mm/px · z∈[-50,+187]mm · 5 of 80 slices shown (4 of 4)]
[im 1/80]
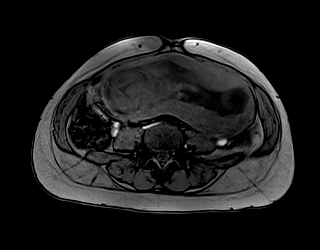
[im 20/80]
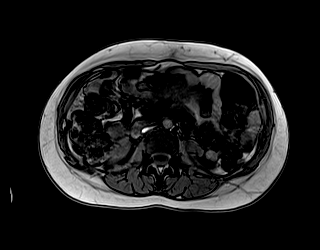
[im 40/80]
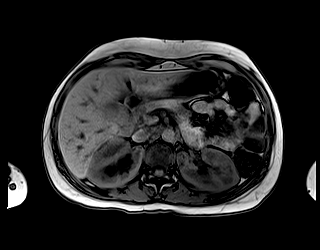
[im 60/80]
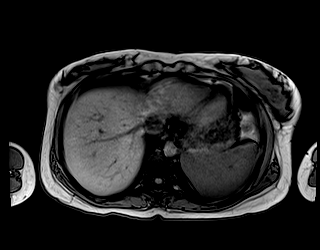
[im 80/80]
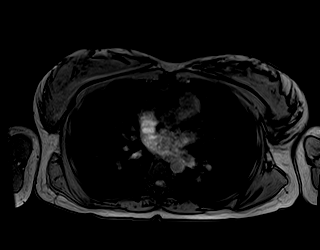

[Series 18: MRCP · coronal · 4.0mm · 1.12mm/px · 1 of 18 slices shown]
[im 1/18]
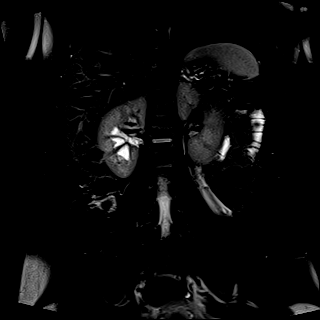

[Series 19: radials · coronal · 50.0mm · 0.78mm/px · 1 of 5 slices shown]
[im 1/5]
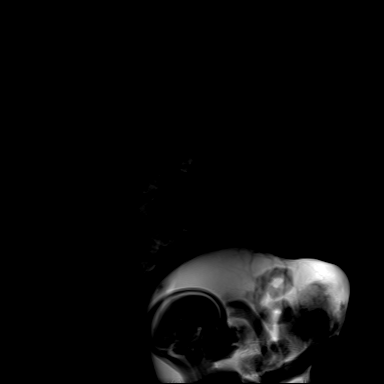

[Series 21: T1 dynamic · axial · non-contrast · 3.0mm · 1.06mm/px · z∈[-39,+174]mm · 4 of 72 slices shown]
[im 1/72]
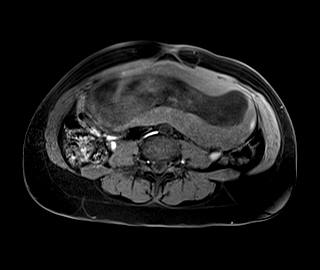
[im 24/72]
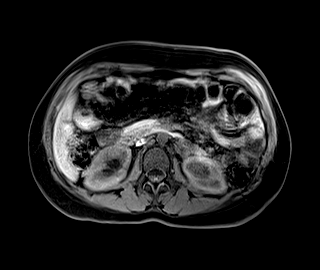
[im 48/72]
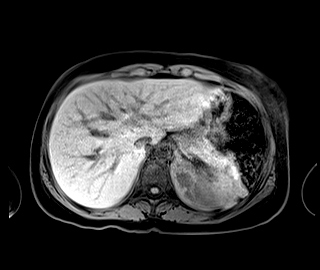
[im 72/72]
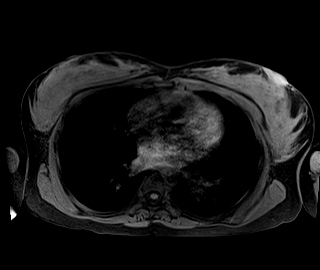

[40 of 48 positions shown; findings below may reference images not displayed]

FINDINGS: Comment: Today's study is limited for detection and characterization
of visceral and/or vascular lesions by lack of IV gadolinium.
Portions of today's examination are also limited by considerable
patient respiratory motion.

Lower chest: Unremarkable.

Hepatobiliary: No definite suspicious cystic or solid hepatic
lesions are noted on today's noncontrast examination. No intra or
extrahepatic biliary ductal dilatation noted on MRCP images. Common
bile duct measures 4 mm in the porta hepatis. No filling defects in
the common bile duct to suggest choledocholithiasis. 1.5 cm filling
defect in the gallbladder, compatible with a gallstone. There is
also some amorphous material lying dependently in the gallbladder
which is mildly T1 hyperintense and T2 hypointense, compatible with
biliary sludge. Gallbladder is normally distended. Gallbladder wall
is normal in thickness. No pericholecystic fluid or surrounding
inflammatory changes.

Pancreas: No pancreatic mass identified on today's noncontrast
examination. No pancreatic ductal dilatation noted on MRCP images.
No peripancreatic fluid collections or inflammatory changes.

Spleen:  Unremarkable.

Adrenals/Urinary Tract: Bilateral kidneys and adrenal glands are
normal in appearance. No hydroureteronephrosis in the visualized
portions of the abdomen.

Stomach/Bowel: Visualized portions are unremarkable.

Vascular/Lymphatic: No aneurysm identified in the visualized
abdominal vasculature on today's noncontrast study. No
lymphadenopathy noted in the abdomen.

Other: Gravid uterus with IUP partially imaged. No significant
volume of ascites.

Musculoskeletal: No aggressive appearing osseous lesions are noted
in the visualized portions of the skeleton.
IMPRESSION: 1. No imaging findings to suggest complicated pancreatitis at this
time.
2. Cholelithiasis and biliary sludge in the gallbladder. No findings
to suggest an acute cholecystitis at this time. Additionally, there
is no evidence of choledocholithiasis.

## 2023-07-07 ENCOUNTER — Ambulatory Visit: Payer: Medicaid Other | Admitting: Family

## 2023-07-07 ENCOUNTER — Encounter: Payer: Self-pay | Admitting: Family

## 2023-07-07 ENCOUNTER — Other Ambulatory Visit: Payer: Self-pay | Admitting: Family

## 2023-07-07 VITALS — BP 104/70 | HR 78 | Temp 97.5°F | Resp 18 | Ht 60.0 in | Wt 120.0 lb

## 2023-07-07 DIAGNOSIS — R1013 Epigastric pain: Secondary | ICD-10-CM | POA: Diagnosis not present

## 2023-07-07 DIAGNOSIS — Z7689 Persons encountering health services in other specified circumstances: Secondary | ICD-10-CM

## 2023-07-07 DIAGNOSIS — H00012 Hordeolum externum right lower eyelid: Secondary | ICD-10-CM | POA: Diagnosis not present

## 2023-07-07 DIAGNOSIS — R102 Pelvic and perineal pain: Secondary | ICD-10-CM

## 2023-07-07 DIAGNOSIS — R140 Abdominal distension (gaseous): Secondary | ICD-10-CM

## 2023-07-07 LAB — POCT URINALYSIS DIPSTICK
Appearance: NORMAL
Bilirubin, UA: NEGATIVE
Blood, UA: NEGATIVE
Glucose, UA: NEGATIVE
Ketones, UA: NEGATIVE
Leukocytes, UA: NEGATIVE
Nitrite, UA: NEGATIVE
Protein, UA: NEGATIVE
Spec Grav, UA: 1.01 (ref 1.010–1.025)
Urobilinogen, UA: NEGATIVE E.U./dL — AB
pH, UA: 6 (ref 5.0–8.0)

## 2023-07-07 MED ORDER — SIMETHICONE 180 MG PO CAPS: 180.0000 mg | ORAL_CAPSULE | Freq: Three times a day (TID) | ORAL | 0 refills | Status: DC

## 2023-07-07 MED ORDER — PANTOPRAZOLE SODIUM 40 MG PO TBEC: 40.0000 mg | DELAYED_RELEASE_TABLET | Freq: Every day | ORAL | 3 refills | Status: DC

## 2023-07-07 MED ORDER — ERYTHROMYCIN 5 MG/GM OP OINT: 1.0000 | TOPICAL_OINTMENT | Freq: Three times a day (TID) | OPHTHALMIC | 0 refills | Status: AC

## 2023-07-07 NOTE — Progress Notes (Signed)
Provider: Richarda Blade FNP-C   Elois Averitt, Donalee Citrin, NP  Patient Care Team: Livie Vanderhoof, Donalee Citrin, NP as PCP - General (Family Medicine)  Extended Emergency Contact Information Primary Emergency Contact: Mikle Bosworth Mobile Phone: [phone] Relation: Relative Interpreter needed? No Secondary Emergency Contact: Mohamed,MAJDI Mobile Phone: [phone] Relation: Spouse  Code Status:  Full Code  Goals of care: Advanced Directive information    05/12/2022    7:02 AM  Advanced Directives  Does Patient Have a Medical Advance Directive? No  Would patient like information on creating a medical advance directive? No - Patient declined     Chief Complaint  Patient presents with   Establish Care    New patient here to establish care and is having stomach bloating    HPI:  Pt is a 32 y.o. female seen today establish care here at [facility] for medical management of chronic diseases.she is here with husband and 2 yr old daughter.   She complains of stomach bloating and burping.sometimes has epigastric pain when bloating is bad and after meals. States one month ago saw some blood in the stool. Status post cholecystectomy ,January 2023.  States had high pressure during pregnancy and preeclampsia.  Also complains of left right eye lower eyelid itchy,tender and swollen x 2 days. Has had yellow drainage. No changes in vision.  Has not had menses.currently on  [place] follows up with [facility].  She does not smoke or drinking alcohol.    Past Medical History:  Diagnosis Date   Gallstones    Headache    History of prior pregnancies    patient has 2 daughters   Pregnancy induced hypertension    Past Surgical History:  Procedure Laterality Date   APPENDECTOMY     CHOLECYSTECTOMY N/A 01/06/2022   Procedure: LAPAROSCOPIC CHOLECYSTECTOMY;  Surgeon: Emelia Loron, MD;  Location: [facility];  Service: General;  Laterality: N/A;    No Known  Allergies  Allergies as of 07/07/2023   No Known Allergies      Medication List        Accurate as of July 07, 2023  4:11 PM. If you have any questions, ask your nurse or doctor.          acetaminophen 325 MG tablet Commonly known as: TYLENOL Take 2 tablets (650 mg total) by mouth every 4 (four) hours as needed (for pain scale < 4  OR  temperature  >/=  100.5 F).   erythromycin ophthalmic ointment Place 1 Application into the right eye 3 (three) times daily for 7 days. Started by: Donalee Citrin Rayen Dafoe   ibuprofen 600 MG tablet Commonly known as: ADVIL Take 1 tablet (600 mg total) by mouth every 6 (six) hours.   multivitamin-prenatal 27-0.8 MG Tabs tablet Take 1 tablet by mouth daily at 12 noon.   Nexplanon 68 MG Impl implant Generic drug: etonogestrel 1 each by Subdermal route once.   pantoprazole 40 MG tablet Commonly known as: PROTONIX Take 1 tablet (40 mg total) by mouth daily. Started by: Donalee Citrin Cheyanna Strick   Simethicone 180 MG Caps Commonly known as: Gas-X Ultra Strength Take 1 capsule (180 mg total) by mouth 3 (three) times daily. Started by: Donalee Citrin Presley Gora        Review of Systems  Constitutional:  Negative for appetite change, chills, fatigue, fever and unexpected weight change.  HENT:  Negative for congestion, dental problem, ear discharge, ear pain, facial swelling, hearing loss, nosebleeds, postnasal drip, rhinorrhea,  sinus pressure, sinus pain, sneezing, sore throat, tinnitus and trouble swallowing.   Eyes:  Positive for pain, redness and itching. Negative for discharge and visual disturbance.  Respiratory:  Negative for cough, chest tightness, shortness of breath and wheezing.   Cardiovascular:  Negative for chest pain, palpitations and leg swelling.  Gastrointestinal:  Positive for abdominal pain. Negative for abdominal distention, blood in stool, constipation, diarrhea, nausea and vomiting.       Bloating   Endocrine: Negative for cold intolerance,  heat intolerance, polydipsia, polyphagia and polyuria.  Genitourinary:  Negative for difficulty urinating, dysuria, flank pain, frequency and urgency.  Musculoskeletal:  Negative for arthralgias, back pain, gait problem, joint swelling, myalgias, neck pain and neck stiffness.  Skin:  Negative for color change, pallor, rash and wound.  Neurological:  Positive for headaches. Negative for dizziness, syncope, speech difficulty, weakness, light-headedness and numbness.       Intermittent headache   Hematological:  Does not bruise/bleed easily.  Psychiatric/Behavioral:  Negative for agitation, behavioral problems, confusion, hallucinations, self-injury, sleep disturbance and suicidal ideas. The patient is not nervous/anxious.     Immunization History  Administered Date(s) Administered   Influenza,inj,Quad PF,6+ Mos 11/12/2018   Pfizer Covid-19 Vaccine Bivalent Booster 76yrs & up 12/13/2019   Tdap 02/09/2022   Pertinent  Health Maintenance Due  Topic Date Due   PAP SMEAR-Modifier  Never done   INFLUENZA VACCINE  07/13/2023      03/06/2022    6:36 PM 05/12/2022    7:05 AM 05/12/2022    3:00 PM 05/12/2022    8:08 PM 05/13/2022    8:00 AM  Fall Risk  (RETIRED) Patient Fall Risk Level Low fall risk Low fall risk Low fall risk Low fall risk Low fall risk   Functional Status Survey:    Vitals:   07/07/23 1340  BP: 104/70  Pulse: 78  Resp: 18  Temp: (!) 97.5 F (36.4 C)  SpO2: 99%  Weight: 120 lb (54.4 kg)  Height: 5' (1.524 m)   Body mass index is 23.44 kg/m. Physical Exam Vitals reviewed.  Constitutional:      General: She is not in acute distress.    Appearance: Normal appearance. She is normal weight. She is not ill-appearing or diaphoretic.  HENT:     Head: Normocephalic.     Right Ear: Tympanic membrane, ear canal and external ear normal. There is no impacted cerumen.     Left Ear: Tympanic membrane, ear canal and external ear normal. There is no impacted cerumen.     Nose:  Nose normal. No congestion or rhinorrhea.     Mouth/Throat:     Mouth: Mucous membranes are moist.     Pharynx: Oropharynx is clear. No oropharyngeal exudate or posterior oropharyngeal erythema.  Eyes:     General: No scleral icterus.       Right eye: Hordeolum present. No discharge.        Left eye: No discharge.     Extraocular Movements: Extraocular movements intact.     Conjunctiva/sclera: Conjunctivae normal.     Pupils: Pupils are equal, round, and reactive to light.     Comments: Right lower eyelid erythema   Neck:     Vascular: No carotid bruit.  Cardiovascular:     Rate and Rhythm: Normal rate and regular rhythm.     Pulses: Normal pulses.     Heart sounds: Normal heart sounds. No murmur heard.    No friction rub. No gallop.  Pulmonary:  Effort: Pulmonary effort is normal. No respiratory distress.     Breath sounds: Normal breath sounds. No wheezing, rhonchi or rales.  Chest:     Chest wall: No tenderness.  Abdominal:     General: Bowel sounds are normal. There is no distension.     Palpations: Abdomen is soft. There is no mass.     Tenderness: There is abdominal tenderness in the epigastric area and suprapubic area. There is no right CVA tenderness, left CVA tenderness, guarding or rebound. Negative signs include Murphy's sign.  Musculoskeletal:        General: No swelling or tenderness. Normal range of motion.     Cervical back: Normal range of motion. No rigidity or tenderness.     Right lower leg: No edema.     Left lower leg: No edema.  Lymphadenopathy:     Cervical: No cervical adenopathy.  Skin:    General: Skin is warm and dry.     Coloration: Skin is not pale.     Findings: No bruising, erythema, lesion or rash.  Neurological:     Mental Status: She is alert and oriented to person, place, and time.     Cranial Nerves: No cranial nerve deficit.     Sensory: No sensory deficit.     Motor: No weakness.     Coordination: Coordination normal.     Gait:  Gait normal.  Psychiatric:        Mood and Affect: Mood normal.        Speech: Speech normal.        Behavior: Behavior normal.        Thought Content: Thought content normal.        Judgment: Judgment normal.    Labs reviewed: No results for input(s): "NA", "K", "CL", "CO2", "GLUCOSE", "BUN", "CREATININE", "CALCIUM", "MG", "PHOS" in the last 8760 hours. No results for input(s): "AST", "ALT", "ALKPHOS", "BILITOT", "PROT", "ALBUMIN" in the last 8760 hours. No results for input(s): "WBC", "NEUTROABS", "HGB", "HCT", "MCV", "PLT" in the last 8760 hours. No results found for: "TSH" No results found for: "HGBA1C" No results found for: "CHOL", "HDL", "LDLCALC", "LDLDIRECT", "TRIG", "CHOLHDL"  Significant Diagnostic Results in last 30 days:  No results found.  Assessment/Plan  1. Epigastric pain Tender to palpation with bloating symptoms. Will r/o H.Pylori. - H. pylori breath test - COMPLETE METABOLIC PANEL WITH GFR; Future - CBC with Differential/Platelet; Future  2. Suprapubic pain, acute Tender on palpation  - POC Urinalysis Dipstick shows no signs of infection.   3. Hordeolum externum of right lower eyelid Small swelling on lower eyelid with surrounding erythema on eyelid.no drainage noted. - advised to cleanse eyelid with warm water wet wash cloth compressor daily. Apply erythromycin as below.  - erythromycin ophthalmic ointment; Place 1 Application into the right eye 3 (three) times daily for 7 days.  Dispense: 21 g; Refill: 0  4. Encounter to establish care Immunization reviewed up to date except due for Pap smear and COVID-19 vaccine.Recommended to schedule for fasting labs.  Family/ staff Communication: Reviewed plan of care with patient and Husband verbalized understanding.   Labs/tests ordered:  - H. pylori breath test - COMPLETE METABOLIC PANEL WITH GFR; Future - CBC with Differential/Platelet; Future - POC Urinalysis Dipstick  Next Appointment : Return in about  1 year (around 07/06/2024) for annual Physical examination, fasting labs in one week.  Caesar Bookman, NP

## 2023-07-11 ENCOUNTER — Telehealth: Payer: Self-pay | Admitting: Family

## 2023-07-11 DIAGNOSIS — Z111 Encounter for screening for respiratory tuberculosis: Secondary | ICD-10-CM

## 2023-07-11 NOTE — Addendum Note (Signed)
Addended byRicharda Blade C on: 07/11/2023 05:14 PM   Modules accepted: Orders

## 2023-07-11 NOTE — Telephone Encounter (Signed)
Patient called and is requesting a Quantiferon Gold blood test for college. She is already scheduled for a fasting lab appointment this Friday, 07/14/23, at 8:30 a.m. Could you send in an order for the lab for this Friday to include the Quantiferon Gold. Thanks!

## 2023-07-11 NOTE — Telephone Encounter (Signed)
Okay.Quantiferon test added.

## 2023-07-13 ENCOUNTER — Encounter: Payer: Self-pay | Admitting: Adult Health

## 2023-07-13 ENCOUNTER — Ambulatory Visit: Payer: Medicaid Other | Admitting: Adult Health

## 2023-07-13 VITALS — BP 118/72 | HR 111 | Temp 97.7°F | Resp 17 | Ht 60.0 in | Wt 117.6 lb

## 2023-07-13 DIAGNOSIS — R1013 Epigastric pain: Secondary | ICD-10-CM

## 2023-07-13 DIAGNOSIS — A048 Other specified bacterial intestinal infections: Secondary | ICD-10-CM | POA: Diagnosis not present

## 2023-07-13 DIAGNOSIS — Z111 Encounter for screening for respiratory tuberculosis: Secondary | ICD-10-CM

## 2023-07-13 MED ORDER — CLARITHROMYCIN 500 MG PO TABS: 500.0000 mg | ORAL_TABLET | Freq: Two times a day (BID) | ORAL | 0 refills | Status: AC

## 2023-07-13 MED ORDER — PANTOPRAZOLE SODIUM 40 MG PO TBEC: DELAYED_RELEASE_TABLET | ORAL | 0 refills | Status: DC

## 2023-07-13 MED ORDER — METRONIDAZOLE 500 MG PO TABS: 500.0000 mg | ORAL_TABLET | Freq: Three times a day (TID) | ORAL | 0 refills | Status: AC

## 2023-07-13 NOTE — Patient Instructions (Signed)
Helicobacter Pylori Infection Helicobacter pylori infection is a bacterial infection in the stomach. Long-term (chronic) infection can cause stomach irritation (gastritis), ulcers in the stomach (gastric ulcers), and ulcers in the upper part of the intestine (duodenal ulcers). Having this infection may also increase your risk of stomach cancer and a type of white blood cell cancer (lymphoma) that affects the stomach. What are the causes? This infection is caused by the Helicobacter pylori (H. pylori) bacteria. Many healthy people have this bacteria in their stomach lining. The bacteria may also spread from person to person through contact with stool (feces) or saliva. It is not known why some people develop ulcers, gastritis, or cancer from the bacteria. What increases the risk? You are more likely to develop this condition if you: Have family members with the infection. Live with many other people, such as in a dormitory. What are the signs or symptoms? Most people with this infection do not have any symptoms. If you do have symptoms, they may include: Heartburn. Stomach pain. Nausea. Vomiting. The vomit may be bloody because of ulcers. Loss of appetite. Bad breath. How is this diagnosed? This condition may be diagnosed based on: Your symptoms and medical history. A physical exam. Blood tests. Stool tests. A breath test. A procedure that involves placing a tube with a camera on the end of it down your throat to examine your stomach and upper intestine (upper endoscopy). Removing and testing a tissue sample from the stomach lining (biopsy). A biopsy may be taken during an upper endoscopy. How is this treated?  This condition is treated by taking a combination of medicines (triple therapy) for several weeks. Triple therapy includes one medicine to reduce the amount of acid in your stomach and two types of antibiotic medicines. This treatment may reduce your risk of cancer. You may need to  be tested for H. pylori again after treatment. In some cases, the treatment may need to be repeated if your treatment did not get rid of all the bacteria. Follow these instructions at home:  Take over-the-counter and prescription medicines only as told by your health care provider. Take your antibiotic medicine as told by your health care provider. Do not stop taking the antibiotics even if you start to feel better. Return to your normal activities as told by your health care provider. Ask your health care provider what activities are safe for you. Take steps to prevent future infections: Wash your hands often with soap and water for at least 20 seconds. If soap and water are not available, use hand sanitizer. Do not eat food or drink water that may have had contact with stool or saliva. Keep all follow-up visits. This is important. You may need tests to make sure your treatment worked. Contact a health care provider if your symptoms: Do not get better with treatment. Return after treatment. Summary Helicobacter pylori infection is a stomach infection caused by the Helicobacter pylori (H. pylori) bacteria. This infection can cause stomach irritation (gastritis), ulcers in the stomach (gastric ulcers), and ulcers in the upper part of the intestine (duodenal ulcers). This condition is treated by taking a combination of medicines (triple therapy) for several weeks. Take your antibiotic medicine as told by your health care provider. Do not stop taking the antibiotics even if you start to feel better. This information is not intended to replace advice given to you by your health care provider. Make sure you discuss any questions you have with your health care provider. Document Revised:   06/16/2021 Document Reviewed: 06/16/2021 Elsevier Patient Education  2024 [employer]  

## 2023-07-13 NOTE — Progress Notes (Signed)
[facility] clinic  Provider:  Kenard Gower DNP  Code Status:   Full Code  Goals of Care:     07/13/2023    9:06 AM  Advanced Directives  Does Patient Have a Medical Advance Directive? No  Would patient like information on creating a medical advance directive? No - Patient declined     Chief Complaint  Patient presents with   Acute Visit    Patient is being seen to discuss labs    Immunizations    Patient is due for covid and flu vaccine    Health Maintenance    Patient is due for Pap smear    HPI: Patient is a 32 y.o. female seen today for an acute visit to discuss lab result. She brought in today with her 2 daughters, 4 and 1 Y/O. She tested positive for H. Pylorie breath test.. She stated that she has bloating for 4 weeks. She was prescribed Simethicone for gas but bloating persisted. She complains of stomache, rated as 5-6/10 when hungry and feels bloated when full. 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  Wt Readings from Last 3 Encounters:  07/13/23 117 lb 9.6 oz (53.3 kg)  07/07/23 120 lb (54.4 kg)  05/12/22 125 lb (56.7 kg)     Past Medical History:  Diagnosis Date   Gallstones    Headache    History of prior pregnancies    patient has 2 daughters   Pregnancy induced hypertension     Past Surgical History:  Procedure Laterality Date   APPENDECTOMY     CHOLECYSTECTOMY N/A 01/06/2022   Procedure: LAPAROSCOPIC CHOLECYSTECTOMY;  Surgeon: Emelia Loron, MD;  Location: [facility] OR;  Service: General;  Laterality: N/A;    No Known Allergies  Outpatient Encounter Medications as of 07/13/2023  Medication Sig   acetaminophen (TYLENOL) 325 MG tablet Take 2 tablets (650 mg total) by mouth every 4 (four) hours as needed (for pain scale < 4  OR  temperature  >/=  100.5 F).   erythromycin ophthalmic ointment Place 1 Application into the right eye 3 (three) times daily for 7 days.   etonogestrel (NEXPLANON) 68 MG IMPL implant 1 each by Subdermal route once.    ibuprofen (ADVIL) 600 MG tablet Take 1 tablet (600 mg total) by mouth every 6 (six) hours.   pantoprazole (PROTONIX) 40 MG tablet Take 1 tablet (40 mg total) by mouth daily.   Prenatal Vit-Fe Fumarate-FA (MULTIVITAMIN-PRENATAL) 27-0.8 MG TABS tablet Take 1 tablet by mouth daily at 12 noon.   Simethicone (GAS-X ULTRA STRENGTH) 180 MG CAPS Take 1 capsule (180 mg total) by mouth 3 (three) times daily.   No facility-administered encounter medications on file as of 07/13/2023.    Review of Systems:  Review of Systems  Constitutional:  Negative for appetite change, chills, fatigue and fever.  HENT:  Negative for congestion, hearing loss, rhinorrhea and sore throat.   Eyes: Negative.   Respiratory:  Negative for cough, shortness of breath and wheezing.   Cardiovascular:  Negative for chest pain, palpitations and leg swelling.  Gastrointestinal:  Positive for abdominal pain. Negative for abdominal distention, constipation, diarrhea, nausea and vomiting.  Genitourinary:  Negative for dysuria.  Musculoskeletal:  Negative for arthralgias, back pain and myalgias.  Skin:  Negative for color change, rash and wound.  Neurological:  Negative for dizziness, weakness and headaches.  Psychiatric/Behavioral:  Negative for behavioral problems. The patient is not nervous/anxious.     Health Maintenance  Topic Date Due  PAP SMEAR-Modifier  Never done   COVID-19 Vaccine (2 - 2023-24 season) 08/12/2022   INFLUENZA VACCINE  07/13/2023   DTaP/Tdap/Td (2 - Td or Tdap) 02/10/2032   Hepatitis C Screening  Completed   HIV Screening  Completed   HPV VACCINES  Aged Out    Physical Exam: Vitals:   07/13/23 0905  BP: 118/72  Pulse: (!) 111  Resp: 17  Temp: 97.7 F (36.5 C)  TempSrc: Temporal  SpO2: 98%  Weight: 117 lb 9.6 oz (53.3 kg)  Height: 5' (1.524 m)   Body mass index is 22.97 kg/m. Physical Exam Constitutional:      Appearance: Normal appearance.  HENT:     Head: Normocephalic and atraumatic.      Nose: Nose normal.     Mouth/Throat:     Mouth: Mucous membranes are moist.  Eyes:     Conjunctiva/sclera: Conjunctivae normal.  Cardiovascular:     Rate and Rhythm: Normal rate and regular rhythm.  Pulmonary:     Effort: Pulmonary effort is normal.     Breath sounds: Normal breath sounds.  Abdominal:     General: Bowel sounds are normal.     Palpations: Abdomen is soft.  Musculoskeletal:        General: Normal range of motion.     Cervical back: Normal range of motion.  Skin:    General: Skin is warm and dry.  Neurological:     General: No focal deficit present.     Mental Status: She is alert and oriented to person, place, and time.  Psychiatric:        Mood and Affect: Mood normal.        Behavior: Behavior normal.        Thought Content: Thought content normal.        Judgment: Judgment normal.     Labs reviewed: Basic Metabolic Panel: No results for input(s): "NA", "K", "CL", "CO2", "GLUCOSE", "BUN", "CREATININE", "CALCIUM", "MG", "PHOS", "TSH" in the last 8760 hours. Liver Function Tests: No results for input(s): "AST", "ALT", "ALKPHOS", "BILITOT", "PROT", "ALBUMIN" in the last 8760 hours. No results for input(s): "LIPASE", "AMYLASE" in the last 8760 hours. No results for input(s): "AMMONIA" in the last 8760 hours. CBC: No results for input(s): "WBC", "NEUTROABS", "HGB", "HCT", "MCV", "PLT" in the last 8760 hours. Lipid Panel: No results for input(s): "CHOL", "HDL", "LDLCALC", "TRIG", "CHOLHDL", "LDLDIRECT" in the last 8760 hours. No results found for: "HGBA1C"  Procedures since last visit: No results found.  Assessment/Plan  1. Helicobacter pylori infection -  will start on Clarithromycin, Metronidazole and Protonix -  instructed not to breastfeed X 14 days - clarithromycin (BIAXIN) 500 MG tablet; Take 1 tablet (500 mg total) by mouth 2 (two) times daily for 14 days.  Dispense: 28 tablet; Refill: 0 - metroNIDAZOLE (FLAGYL) 500 MG tablet; Take 1 tablet  (500 mg total) by mouth 3 (three) times daily for 14 days.  Dispense: 42 tablet; Refill: 0 - pantoprazole (PROTONIX) 40 MG tablet; Take 1 tablet (40 mg total) by mouth 2 (two) times daily for 14 days, THEN 1 tablet (40 mg total) daily.  Dispense: 328 tablet; Refill: 0  2. Epigastric pain -  due to H. Pylorie infection  3. Screening-pulmonary TB -  denies cough nor night sweats - QuantiFERON-TB Gold Plus   Labs/tests ordered:  Quantiferon TB gold plus  Next appt:  01/08/2024

## 2023-07-14 ENCOUNTER — Other Ambulatory Visit: Payer: Medicaid Other

## 2023-07-14 DIAGNOSIS — R1013 Epigastric pain: Secondary | ICD-10-CM

## 2023-07-17 ENCOUNTER — Telehealth: Payer: Self-pay | Admitting: *Deleted

## 2023-07-17 DIAGNOSIS — R7612 Nonspecific reaction to cell mediated immunity measurement of gamma interferon antigen response without active tuberculosis: Secondary | ICD-10-CM

## 2023-07-17 NOTE — Telephone Encounter (Signed)
Patient walked into office wanting to know if the Chest X-day was ordered and when she could go.   I explained to patient that a message was sent to [facility] requesting an order but that she was still seeing patient's. I also explained to patient that a referral was going to be placed to Infectious Disease and they Zoeann Mol want to do all the testing.   Pended Referral for approval.   She agreed and stated that she will wait to hear back from [facility]

## 2023-07-18 ENCOUNTER — Encounter: Payer: Self-pay | Admitting: *Deleted

## 2023-07-19 ENCOUNTER — Ambulatory Visit
Admission: RE | Admit: 2023-07-19 | Discharge: 2023-07-19 | Disposition: A | Discharge status: Home / Self Care | Payer: Medicaid Other | Source: Ambulatory Visit | Attending: Family | Admitting: Family

## 2023-07-19 DIAGNOSIS — R7612 Nonspecific reaction to cell mediated immunity measurement of gamma interferon antigen response without active tuberculosis: Secondary | ICD-10-CM

## 2023-07-27 ENCOUNTER — Telehealth: Payer: Self-pay | Admitting: Internal Medicine

## 2023-07-27 ENCOUNTER — Other Ambulatory Visit: Payer: Self-pay

## 2023-07-27 ENCOUNTER — Ambulatory Visit (INDEPENDENT_AMBULATORY_CARE_PROVIDER_SITE_OTHER): Payer: Medicaid Other | Admitting: Internal Medicine

## 2023-07-27 ENCOUNTER — Encounter: Payer: Self-pay | Admitting: Internal Medicine

## 2023-07-27 VITALS — BP 105/72 | HR 93 | Temp 98.1°F | Resp 16 | Wt 112.0 lb

## 2023-07-27 DIAGNOSIS — Z227 Latent tuberculosis: Secondary | ICD-10-CM

## 2023-07-27 MED ORDER — ISONIAZID 300 MG PO TABS: 300.0000 mg | ORAL_TABLET | Freq: Every day | ORAL | 5 refills | Status: DC

## 2023-07-27 MED ORDER — PYRIDOXINE HCL 50 MG PO TABS: 50.0000 mg | ORAL_TABLET | Freq: Every day | ORAL | 5 refills | Status: DC

## 2023-07-27 NOTE — Progress Notes (Signed)
[facility]      Reason for Consult: latent Tb    Referring Physician: Dr. Grayce Sessions    Patient ID: [name], female    DOB: [birth year], 32 y.o.   MRN: [id number]  HPI:   Angela Russo is here for evaluation of a positive Quantiferon Gold test.  No previous test with either ppd or Quantiferon Gold.  From [place] originally.  Did have BCG vaccine.  No known exposures.  No current symptoms and CXR wnl.  Was tested for school.  No fever, no night sweats.  No weight loss.   Past Medical History:  Diagnosis Date   Gallstones    Headache    History of prior pregnancies    patient has 2 daughters   Pregnancy induced hypertension     Prior to Admission medications   Medication Sig Start Date End Date Taking? Authorizing Provider  acetaminophen (TYLENOL) 325 MG tablet Take 2 tablets (650 mg total) by mouth every 4 (four) hours as needed (for pain scale < 4  OR  temperature  >/=  100.5 F). 05/13/22  Yes Roma Schanz, CNM  clarithromycin (BIAXIN) 500 MG tablet Take 1 tablet (500 mg total) by mouth 2 (two) times daily for 14 days. 07/13/23 07/27/23 Yes Medina-Vargas, Monina C, NP  etonogestrel (NEXPLANON) 68 MG IMPL implant 1 each by Subdermal route once.   Yes [provider]  ibuprofen (ADVIL) 600 MG tablet Take 1 tablet (600 mg total) by mouth every 6 (six) hours. 05/13/22  Yes Roma Schanz, CNM  metroNIDAZOLE (FLAGYL) 500 MG tablet Take 1 tablet (500 mg total) by mouth 3 (three) times daily for 14 days. 07/13/23 07/27/23 Yes Medina-Vargas, Monina C, NP  pantoprazole (PROTONIX) 40 MG tablet Take 1 tablet (40 mg total) by mouth 2 (two) times daily for 14 days, THEN 1 tablet (40 mg total) daily. 07/13/23 05/22/24 Yes Medina-Vargas, Monina C, NP  Prenatal Vit-Fe Fumarate-FA (MULTIVITAMIN-PRENATAL) 27-0.8 MG TABS tablet Take 1 tablet by mouth daily at 12 noon.   Yes [provider]  Simethicone (GAS-X ULTRA STRENGTH) 180 MG CAPS Take 1 capsule (180 mg total)  by mouth 3 (three) times daily. 07/07/23  Yes Ngetich, Dinah C, NP    No Known Allergies  Social History   Tobacco Use   Smoking status: Never   Smokeless tobacco: Never  Vaping Use   Vaping status: Never Used  Substance Use Topics   Alcohol use: Never   Drug use: Never    Family History  Problem Relation Age of Onset   Cancer Mother        leukemia   Diabetes Father    Anemia Sister    Hypertension Brother     Review of Systems  Constitutional: negative for fevers, chills, and sweats All other systems reviewed and are negative    Constitutional: in no apparent distress  Vitals:   07/27/23 0953  BP: 105/72  Pulse: 93  Resp: 16  Temp: 98.1 F (36.7 C)  SpO2: 98%   EYES: anicteric Respiratory: normal respiratory effort  Labs: Lab Results  Component Value Date   WBC 5.3 07/13/2023   HGB 12.9 07/13/2023   HCT 38.0 07/13/2023   MCV 88.6 07/13/2023   PLT 305 07/13/2023    Lab Results  Component Value Date   CREATININE 0.56 07/13/2023   BUN 7 07/13/2023   NA 142 07/13/2023   K 3.8 07/13/2023   CL 107 07/13/2023   CO2  24 07/13/2023    Lab Results  Component Value Date   ALT 10 07/13/2023   AST 15 07/13/2023   ALKPHOS 202 (H) 05/13/2022   BILITOT 0.4 07/13/2023     Assessment: latent Tb.  I discussed indications for treatment and different treatment options.  I discussed BCG and may be positive due to vaccinations status but unable to determine.   Recommend treatment and will use INH with B6.  Avoid rifampin with etonogestrel.   Recent LFTs wnl  Plan: 1)  INH 300 mg daily + B6 daily for 6 months 2) repeat hepatic function panel in 4 weeks and HIV Follow up in 4 weeks.

## 2023-07-27 NOTE — Telephone Encounter (Signed)
Angela Russo stated she was prescribed a medication at today's visit but forgot to mention she is currently breastfeeding. She has concerns and wondered if it was safe. She can be reached at [phone].

## 2023-08-24 ENCOUNTER — Ambulatory Visit: Payer: Medicaid Other | Admitting: Internal Medicine

## 2023-10-28 ENCOUNTER — Ambulatory Visit (HOSPITAL_COMMUNITY)
Admission: RE | Admit: 2023-10-28 | Discharge: 2023-10-28 | Disposition: A | Discharge status: Home / Self Care | Payer: Medicaid Other | Source: Ambulatory Visit | Attending: Emergency Medicine | Admitting: Emergency Medicine

## 2023-10-28 ENCOUNTER — Encounter (HOSPITAL_COMMUNITY): Payer: Self-pay

## 2023-10-28 VITALS — BP 114/78 | HR 98 | Temp 97.9°F | Resp 16

## 2023-10-28 DIAGNOSIS — K1379 Other lesions of oral mucosa: Secondary | ICD-10-CM | POA: Insufficient documentation

## 2023-10-28 DIAGNOSIS — J029 Acute pharyngitis, unspecified: Secondary | ICD-10-CM | POA: Insufficient documentation

## 2023-10-28 DIAGNOSIS — R0981 Nasal congestion: Secondary | ICD-10-CM | POA: Insufficient documentation

## 2023-10-28 DIAGNOSIS — H1033 Unspecified acute conjunctivitis, bilateral: Secondary | ICD-10-CM | POA: Diagnosis present

## 2023-10-28 LAB — POCT RAPID STREP A (OFFICE): Rapid Strep A Screen: NEGATIVE

## 2023-10-28 MED ORDER — CETIRIZINE HCL 10 MG PO TABS: 10.0000 mg | ORAL_TABLET | Freq: Every day | ORAL | 2 refills | Status: DC

## 2023-10-28 MED ORDER — LIDOCAINE VISCOUS HCL 2 % MT SOLN: 15.0000 mL | OROMUCOSAL | 0 refills | Status: DC | PRN

## 2023-10-28 MED ORDER — MOXIFLOXACIN HCL 0.5 % OP SOLN: 1.0000 [drp] | Freq: Three times a day (TID) | OPHTHALMIC | 0 refills | Status: DC

## 2023-10-28 MED ORDER — IBUPROFEN 800 MG PO TABS
800.0000 mg | ORAL_TABLET | Freq: Three times a day (TID) | ORAL | 0 refills | Status: DC
Start: 2023-10-28 — End: 2024-04-06

## 2023-10-28 NOTE — Discharge Instructions (Addendum)
Eye drops - 1 drop in both eyes 3x daily for a week  Ibuprofen - can be alternated with tylenol every 4 hours for pain  Lidocaine - can be swished around the mouth, gargled and then spit out, to help with mouth sore and sore throat  Zyrtec - take one pill daily to help with runny nose

## 2023-10-28 NOTE — ED Provider Notes (Signed)
[facility]    CSN: [id number] Arrival date & time: 10/28/23  1611     History   Chief Complaint Chief Complaint  Patient presents with   Sore Throat   Appointment    sore throat, right eye redness and discharge, headache, mouth ulcer.    HPI Angela Russo is a 32 y.o. female.  4 day history of sore throat rated 10/10 with swallowing Slight improvement with tylenol  Also having nasal congestion No fever  2 day history of eye irritation, in the morning there is discharge and lashes matted shut. No vision changes or eye pain  Had a family reunion last week, several sick contacts  Concerns for a sore on the roof of mouth May have eaten something hot before symptom started  She is currently breastfeeding LMP is now   Past Medical History:  Diagnosis Date   Gallstones    Headache    History of prior pregnancies    patient has 2 daughters   Pregnancy induced hypertension     Patient Active Problem List   Diagnosis Date Noted   TB lung, latent 07/27/2023   History of proteinuria syndrome 05/12/2022   Normal labor 05/12/2022   S/P laparoscopic cholecystectomy 01/07/2022   Cholecystolithiasis complicating pregnancy in second trimester 01/06/2022   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    Past Surgical History:  Procedure Laterality Date   APPENDECTOMY     CHOLECYSTECTOMY N/A 01/06/2022   Procedure: LAPAROSCOPIC CHOLECYSTECTOMY;  Surgeon: Emelia Loron, MD;  Location: [facility] OR;  Service: General;  Laterality: N/A;    OB History     Gravida  4   Para  2   Term  2   Preterm      AB  2   Living  2      SAB  2   IAB  0   Ectopic      Multiple  0   Live Births  2        Obstetric Comments  Pre-e  2020          Home Medications    Prior to Admission medications   Medication Sig Start Date End Date Taking? Authorizing Provider  cetirizine (ZYRTEC ALLERGY) 10 MG tablet Take 1 tablet (10 mg total) by mouth daily. 10/28/23  Yes  Deondria Puryear, Lurena Joiner, PA-C  ibuprofen (ADVIL) 800 MG tablet Take 1 tablet (800 mg total) by mouth 3 (three) times daily. 10/28/23  Yes Jaxton Casale, PA-C  lidocaine (XYLOCAINE) 2 % solution Use as directed 15 mLs in the mouth or throat every 3 (three) hours as needed for mouth pain. Swish/gargle and spit out 10/28/23  Yes Odis Wickey, Lurena Joiner, PA-C  moxifloxacin (VIGAMOX) 0.5 % ophthalmic solution Place 1 drop into both eyes 3 (three) times daily. 10/28/23  Yes Davielle Lingelbach, Lurena Joiner, PA-C  etonogestrel (NEXPLANON) 68 MG IMPL implant 1 each by Subdermal route once.    [provider]  isoniazid (NYDRAZID) 300 MG tablet Take 1 tablet (300 mg total) by mouth daily. 07/27/23   Gardiner Barefoot, MD  Prenatal Vit-Fe Fumarate-FA (MULTIVITAMIN-PRENATAL) 27-0.8 MG TABS tablet Take 1 tablet by mouth daily at 12 noon.    [provider]  pyridOXINE (B-6) 50 MG tablet Take 1 tablet (50 mg total) by mouth daily. 07/27/23   Comer, Belia Heman, MD    Family History Family History  Problem Relation Age of Onset   Cancer Mother        leukemia   Diabetes Father  Anemia Sister    Hypertension Brother     Social History Social History   Tobacco Use   Smoking status: Never   Smokeless tobacco: Never  Vaping Use   Vaping status: Never Used  Substance Use Topics   Alcohol use: Never   Drug use: Never     Allergies   Patient has no known allergies.   Review of Systems Review of Systems As per HPI  Physical Exam Triage Vital Signs ED Triage Vitals  Encounter Vitals Group     BP      Systolic BP Percentile      Diastolic BP Percentile      Pulse      Resp      Temp      Temp src      SpO2      Weight      Height      Head Circumference      Peak Flow      Pain Score      Pain Loc      Pain Education      Exclude from Growth Chart    No data found.  Updated Vital Signs BP 114/78 (BP Location: Right Arm)   Pulse 98   Temp 97.9 F (36.6 C) (Oral)   Resp 16   SpO2 98%    Breastfeeding Yes    Physical Exam Vitals and nursing note reviewed.  Constitutional:      General: She is not in acute distress.    Appearance: She is not ill-appearing.  HENT:     Right Ear: Tympanic membrane and ear canal normal.     Left Ear: Tympanic membrane and ear canal normal.     Nose: Rhinorrhea present. No congestion.     Mouth/Throat:     Mouth: Mucous membranes are moist. Oral lesions present.     Pharynx: Oropharynx is clear. No posterior oropharyngeal erythema.     Tonsils: 0 on the right. 0 on the left.      Comments: Irritation on roof of mouth Eyes:     General:        Right eye: No discharge.        Left eye: No discharge.     Extraocular Movements: Extraocular movements intact.     Conjunctiva/sclera: Conjunctivae normal.     Pupils: Pupils are equal, round, and reactive to light.     Comments: Normal eye exam  Cardiovascular:     Rate and Rhythm: Normal rate and regular rhythm.     Pulses: Normal pulses.     Heart sounds: Normal heart sounds.  Pulmonary:     Effort: Pulmonary effort is normal.     Breath sounds: Normal breath sounds.  Abdominal:     Palpations: Abdomen is soft.     Tenderness: There is no abdominal tenderness.  Musculoskeletal:     Cervical back: Normal range of motion.  Lymphadenopathy:     Cervical: No cervical adenopathy.  Skin:    General: Skin is warm and dry.  Neurological:     Mental Status: She is alert and oriented to person, place, and time.     UC Treatments / Results  Labs (all labs ordered are listed, but only abnormal results are displayed) Labs Reviewed  POCT RAPID STREP A (OFFICE)    EKG  Radiology No results found.  Procedures Procedures   Medications Ordered in UC Medications - No data to display  Initial Impression /  Assessment and Plan / UC Course  I have reviewed the triage vital signs and the nursing notes.  Pertinent labs & imaging results that were available during my care of the patient  were reviewed by me and considered in my medical decision making (see chart for details).  Afebrile and well appearing Rapid strep negative. Good throat exam. Suspect viral. Recommend ibuprofen alternated with tylenol Runny nose/congestion try zyrtec daily   Bacterial conjunc, bilat Patient describes eye discharge although not noted on exam. Vigamox TID x 7 days  Irritation roof of mouth, thinks she had something hot may have burned it. Lidocaine swish and spit, can also gargle to help sore throat  Can return if needed. No questions at this time   Final Clinical Impressions(s) / UC Diagnoses   Final diagnoses:  Viral pharyngitis  Acute bacterial conjunctivitis of both eyes  Nasal congestion  Mouth sore     Discharge Instructions      Eye drops - 1 drop in both eyes 3x daily for a week  Ibuprofen - can be alternated with tylenol every 4 hours for pain  Lidocaine - can be swished around the mouth, gargled and then spit out, to help with mouth sore and sore throat  Zyrtec - take one pill daily to help with runny nose     ED Prescriptions     Medication Sig Dispense Auth. Provider   moxifloxacin (VIGAMOX) 0.5 % ophthalmic solution Place 1 drop into both eyes 3 (three) times daily. 3 mL Amin Fornwalt, PA-C   cetirizine (ZYRTEC ALLERGY) 10 MG tablet Take 1 tablet (10 mg total) by mouth daily. 30 tablet Capri Raben, PA-C   ibuprofen (ADVIL) 800 MG tablet Take 1 tablet (800 mg total) by mouth 3 (three) times daily. 21 tablet Daymon Hora, PA-C   lidocaine (XYLOCAINE) 2 % solution Use as directed 15 mLs in the mouth or throat every 3 (three) hours as needed for mouth pain. Swish/gargle and spit out 100 mL Roemello Speyer, Lurena Joiner, PA-C      PDMP not reviewed this encounter.   Marlow Baars, [place] 10/28/23 1735

## 2023-10-28 NOTE — ED Triage Notes (Signed)
Pt c/o very bad sore throat, right eye redness, congestion and discharge, headache, mouth ulcer that started Thursday night.

## 2023-10-31 LAB — CULTURE, GROUP A STREP (THRC)

## 2024-01-08 ENCOUNTER — Encounter: Payer: Self-pay | Admitting: Family

## 2024-01-08 ENCOUNTER — Ambulatory Visit (INDEPENDENT_AMBULATORY_CARE_PROVIDER_SITE_OTHER): Payer: Medicaid Other | Admitting: Family

## 2024-01-08 VITALS — BP 128/72 | HR 89 | Temp 97.7°F | Resp 20 | Ht 60.0 in | Wt 115.2 lb

## 2024-01-08 DIAGNOSIS — R1030 Lower abdominal pain, unspecified: Secondary | ICD-10-CM | POA: Diagnosis not present

## 2024-01-08 DIAGNOSIS — N926 Irregular menstruation, unspecified: Secondary | ICD-10-CM | POA: Diagnosis not present

## 2024-01-08 DIAGNOSIS — R0981 Nasal congestion: Secondary | ICD-10-CM

## 2024-01-08 DIAGNOSIS — Z227 Latent tuberculosis: Secondary | ICD-10-CM

## 2024-01-08 DIAGNOSIS — R519 Headache, unspecified: Secondary | ICD-10-CM

## 2024-01-08 DIAGNOSIS — Z0000 Encounter for general adult medical examination without abnormal findings: Secondary | ICD-10-CM | POA: Diagnosis not present

## 2024-01-08 LAB — POCT URINALYSIS DIPSTICK
Bilirubin, UA: NEGATIVE
Blood, UA: NEGATIVE
Glucose, UA: NEGATIVE
Ketones, UA: NEGATIVE
Leukocytes, UA: NEGATIVE
Nitrite, UA: NEGATIVE
Protein, UA: NEGATIVE
Spec Grav, UA: 1.01 (ref 1.010–1.025)
Urobilinogen, UA: 0.2 U/dL
pH, UA: 7 (ref 5.0–8.0)

## 2024-01-08 LAB — POCT URINE PREGNANCY: Preg Test, Ur: NEGATIVE

## 2024-01-08 NOTE — Progress Notes (Signed)
Provider: Richarda Blade FNP-C   Anthonie Lotito, Donalee Citrin, NP  Patient Care Team: Sanaiyah Kirchhoff, Donalee Citrin, NP as PCP - General (Family Medicine)  Extended Emergency Contact Information Primary Emergency Contact: Mikle Bosworth Mobile Phone: [phone] Relation: Relative Interpreter needed? No Secondary Emergency Contact: Mohamed,MAJDI Mobile Phone: [phone] Relation: Spouse  Code Status:  Full Code  Goals of care: Advanced Directive information    01/08/2024    3:22 PM  Advanced Directives  Does Patient Have a Medical Advance Directive? No  Would patient like information on creating a medical advance directive? No - Patient declined     Chief Complaint  Patient presents with   Medical Management of Chronic Issues    6 month follow and physical and discuss cervical cancer screening,covid,and flu vaccines.   Discussed the use of AI scribe software for clinical note transcription with the patient, who gave verbal consent to proceed.  HPI:  Pt is a 34 y.o. female seen today with a history of latent tuberculosis and recent removal of Nexplanon, presents for a six-month follow-up. She reports a persistent nasal congestion, particularly at night, which she has been managing with an over-the-counter decongestant nasal spray. She also mentions a recent episode of a severe sore throat for which she sought urgent care and was prescribed cefazolin, which has since resolved the issue.  The patient also reports daily headaches, which have been more severe over the past week since the removal of Nexplanon. The headaches are often located on the sides of the head. She has been managing the pain with Tylenol and ibuprofen. The patient also mentions a concern about potential pregnancy, but acknowledges it is too early to test.  The patient also reports a painful area on her gums, but no redness is observed. She has not been experiencing any coughing or night sweats. She also reports a recent  increase in weight and admits to not getting regular exercise, attributing this to being busy with her young child. She also admits to not drinking enough water daily.  The patient is currently taking prenatal vitamins and has recently stopped using prescribed eye drops and lidocaine solution following resolution of an eye infection. She is also not currently using prescribed Zyrtec for allergies. She is planning to start treatment for latent tuberculosis after she stops breastfeeding her child.        Past Medical History:  Diagnosis Date   Gallstones    Headache    History of prior pregnancies    patient has 2 daughters   Pregnancy induced hypertension    Past Surgical History:  Procedure Laterality Date   APPENDECTOMY     CHOLECYSTECTOMY N/A 01/06/2022   Procedure: LAPAROSCOPIC CHOLECYSTECTOMY;  Surgeon: Emelia Loron, MD;  Location: [facility] OR;  Service: General;  Laterality: N/A;    No Known Allergies  Allergies as of 01/08/2024   No Known Allergies      Medication List        Accurate as of January 08, 2024  3:46 PM. If you have any questions, ask your nurse or doctor.          STOP taking these medications    cetirizine 10 MG tablet Commonly known as: ZyrTEC Allergy Stopped by: Donalee Citrin Samarah Hogle   lidocaine 2 % solution Commonly known as: XYLOCAINE Stopped by: Donalee Citrin Dawnell Bryant   moxifloxacin 0.5 % ophthalmic solution Commonly known as: Vigamox Stopped by: Donalee Citrin Tarrie Mcmichen   Nexplanon 68 MG Impl implant Generic drug: etonogestrel Stopped by: Carilyn Goodpasture  C Emillio Ngo       TAKE these medications    ibuprofen 800 MG tablet Commonly known as: ADVIL Take 1 tablet (800 mg total) by mouth 3 (three) times daily.   isoniazid 300 MG tablet Commonly known as: NYDRAZID Take 1 tablet (300 mg total) by mouth daily.   multivitamin-prenatal 27-0.8 MG Tabs tablet Take 1 tablet by mouth daily at 12 noon.   pyridOXINE 50 MG tablet Commonly known as: B-6 Take 1 tablet  (50 mg total) by mouth daily.        Review of Systems  Constitutional:  Negative for appetite change, chills, fatigue, fever and unexpected weight change.  HENT:  Negative for congestion, dental problem, ear discharge, ear pain, facial swelling, hearing loss, nosebleeds, postnasal drip, rhinorrhea, sinus pressure, sinus pain, sneezing, sore throat, tinnitus and trouble swallowing.   Eyes:  Negative for pain, discharge, redness, itching and visual disturbance.  Respiratory:  Negative for cough, chest tightness, shortness of breath and wheezing.   Cardiovascular:  Negative for chest pain, palpitations and leg swelling.  Gastrointestinal:  Negative for abdominal distention, abdominal pain, blood in stool, constipation, diarrhea, nausea and vomiting.  Endocrine: Negative for cold intolerance, heat intolerance, polydipsia, polyphagia and polyuria.  Genitourinary:  Negative for difficulty urinating, dysuria, flank pain, frequency and urgency.  Musculoskeletal:  Negative for arthralgias, back pain, gait problem, joint swelling, myalgias, neck pain and neck stiffness.  Skin:  Negative for color change, pallor, rash and wound.  Neurological:  Positive for headaches. Negative for dizziness, syncope, speech difficulty, weakness, light-headedness and numbness.  Hematological:  Does not bruise/bleed easily.  Psychiatric/Behavioral:  Negative for agitation, behavioral problems, confusion, hallucinations, self-injury, sleep disturbance and suicidal ideas. The patient is not nervous/anxious.     Immunization History  Administered Date(s) Administered   Influenza,inj,Quad PF,6+ Mos 11/12/2018   Influenza-Unspecified 07/14/2023   Moderna Covid-19 Fall Seasonal Vaccine 75yrs & older 07/08/2023   Pfizer Covid-19 Vaccine Bivalent Booster 66yrs & up 12/13/2019   Tdap 02/09/2022   Pertinent  Health Maintenance Due  Topic Date Due   INFLUENZA VACCINE  Completed      05/12/2022    8:08 PM 05/13/2022    8:00  AM 07/13/2023    9:06 AM 07/27/2023    9:55 AM 01/08/2024    3:18 PM  Fall Risk  Falls in the past year?   0 0 0  Was there an injury with Fall?   0 0 0  Fall Risk Category Calculator   0 0 0  (RETIRED) Patient Fall Risk Level Low fall risk Low fall risk     Patient at Risk for Falls Due to   No Fall Risks No Fall Risks   Fall risk Follow up   Falls evaluation completed Falls evaluation completed    Functional Status Survey:    Vitals:   01/08/24 1524  BP: 128/72  Pulse: 100  Resp: 20  Temp: 97.7 F (36.5 C)  SpO2: 98%  Weight: 115 lb 3.2 oz (52.3 kg)  Height: 5' (1.524 m)   Body mass index is 22.5 kg/m. Physical Exam  VITALS: T- 97.7, BP- 128/72, SaO2- 98, HR 89 MEASUREMENTS: WT- 115.2 HEENT: Nasal mucosa bilaterally swollen. right External auditory canals with small amount of wax, tympanic membranes normal. Oropharynx without erythema, uvula midline. Pupillary response to light normal. NECK: No palpable lymphadenopathy. Thyroid not enlarged. CHEST: Lung sounds clear upon auscultation. CARDIOVASCULAR: Heart sounds normal upon auscultation. ABDOMEN: Abdomen soft, non-tender upon palpation. No tenderness over  bladder except bilateral lower tender to palpation.       Labs reviewed: Recent Labs    07/13/23 0933  NA 142  K 3.8  CL 107  CO2 24  GLUCOSE 87  BUN 7  CREATININE 0.56  CALCIUM 9.8   Recent Labs    07/13/23 0933  AST 15  ALT 10  BILITOT 0.4  PROT 7.5   Recent Labs    07/13/23 0933  WBC 5.3  NEUTROABS 2,184  HGB 12.9  HCT 38.0  MCV 88.6  PLT 305   No results found for: "TSH" No results found for: "HGBA1C" No results found for: "CHOL", "HDL", "LDLCALC", "LDLDIRECT", "TRIG", "CHOLHDL"  Significant Diagnostic Results in last 30 days:  No results found.  Assessment/Plan    1.Nasal Congestion Reports runny nose and severe nasal congestion, especially at night. Currently using an over-the-counter nasal decongestant spray, likely causing  rebound congestion. Examination reveals swollen nasal passages. Discussed risks of overusing decongestant sprays and recommended Claritin for runny nose and congestion. Advised using a humidifier and increasing water intake to prevent dehydration. - Discontinue over-the-counter nasal decongestant spray - Start Flonase nasal spray - Recommend Claritin - Advise use of a humidifier - Encourage increased water intake  2.Headaches Reports daily headaches, sometimes localized to the sides of the head. Recently removed Nexplanon implant, suspecting headaches may be related to hormonal changes or dehydration. No other associated symptoms. Discussed the importance of hydration. - Encourage increased water intake - Monitor headache frequency and severity - Consider further evaluation if headaches persist or worsen  3.Latent Tuberculosis Diagnosed via TB screening test, with a negative chest X-ray. Advised to start treatment after weaning from breastfeeding, expected by the end of the month. Discussed the importance of treating latent TB to prevent progression to active TB. - Start treatment for latent tuberculosis after weaning from breastfeeding - Follow up with healthcare provider to initiate treatment  4.Annual Physical Exam  Blood pressure 128/72 mmHg, heart rate slightly elevated without symptoms of anxiety or palpitations. Slight weight increase, no recent exercise. Last menstrual period January 1. COVID-19 vaccination received in July last year. Due for Pap smear, had annual follow-up with OB GYN in December. - Request records from OB GYN to update Pap smear status - Encourage regular exercise - Update COVID-19 vaccination record in MyChart  5.Bilateral lower abdominal Tenderness  Tender to palpate on bilateral abdomen. - will obtain urine specimen to rule out UTI  - Urine dip stick negative  - POC Urinalysis Dipstick - POCT urine pregnancy  6.Missed menses LMP 11/12/2023 - POC  Urinalysis Dipstick - POCT urine pregnancy  Follow-up - Recheck heart rate - Collect urine specimen for pregnancy test and infection screening - Order blood work to check electrolytes, kidney function, and anemia.      Family/ staff Communication: Reviewed plan of care with patient and Husband verbalized Understanding   Labs/tests ordered:  - CBC with Differential/Platelet - Basic metabolic panel  Next Appointment : Return in about 1 year (around 01/07/2025) for annual Physical examination.   Caesar Bookman, NP

## 2024-01-09 LAB — CBC WITH DIFFERENTIAL/PLATELET
Absolute Lymphocytes: 2030 {cells}/uL (ref 850–3900)
Absolute Monocytes: 517 {cells}/uL (ref 200–950)
Basophils Absolute: 29 {cells}/uL (ref 0–200)
Basophils Relative: 0.7 %
Eosinophils Absolute: 111 {cells}/uL (ref 15–500)
Eosinophils Relative: 2.7 %
HCT: 38 % (ref 35.0–45.0)
Hemoglobin: 12.8 g/dL (ref 11.7–15.5)
MCH: 30.8 pg (ref 27.0–33.0)
MCHC: 33.7 g/dL (ref 32.0–36.0)
MCV: 91.6 fL (ref 80.0–100.0)
MPV: 10.2 fL (ref 7.5–12.5)
Monocytes Relative: 12.6 %
Neutro Abs: 1415 {cells}/uL — ABNORMAL LOW (ref 1500–7800)
Neutrophils Relative %: 34.5 %
Platelets: 262 10*3/uL (ref 140–400)
RBC: 4.15 10*6/uL (ref 3.80–5.10)
RDW: 11.4 % (ref 11.0–15.0)
Total Lymphocyte: 49.5 %
WBC: 4.1 10*3/uL (ref 3.8–10.8)

## 2024-01-09 LAB — BASIC METABOLIC PANEL
BUN/Creatinine Ratio: 13 (calc) (ref 6–22)
BUN: 6 mg/dL — ABNORMAL LOW (ref 7–25)
CO2: 25 mmol/L (ref 20–32)
Calcium: 9.1 mg/dL (ref 8.6–10.2)
Chloride: 106 mmol/L (ref 98–110)
Creat: 0.45 mg/dL — ABNORMAL LOW (ref 0.50–0.97)
Glucose, Bld: 92 mg/dL (ref 65–139)
Potassium: 4 mmol/L (ref 3.5–5.3)
Sodium: 139 mmol/L (ref 135–146)

## 2024-01-12 ENCOUNTER — Other Ambulatory Visit: Payer: Self-pay

## 2024-02-13 ENCOUNTER — Ambulatory Visit
Admission: RE | Admit: 2024-02-13 | Discharge: 2024-02-13 | Disposition: A | Discharge status: Home / Self Care | Source: Ambulatory Visit | Attending: Family Medicine | Admitting: Family Medicine

## 2024-02-13 VITALS — BP 106/72 | HR 88 | Temp 97.6°F | Resp 17

## 2024-02-13 DIAGNOSIS — O9279 Other disorders of lactation: Secondary | ICD-10-CM | POA: Diagnosis not present

## 2024-02-13 NOTE — ED Triage Notes (Signed)
 Pt presents with bilateral breast pain, swelling and tenderness X 2 days. Pt states the pain radiated to the back.

## 2024-02-13 NOTE — ED Provider Notes (Addendum)
 [facility]    CSN: [id number] Arrival date & time: 02/13/24  1450      History   Chief Complaint Chief Complaint  Patient presents with   Breast Problem    I stopped breastfeeding my 56 month old two days ago and I have a very bad pain, tenderness, right breast engorged and pain on my right shoulder, I take pain medication but it does not work. - Entered by patient    HPI Angela Russo is a 33 y.o. female presents for breast pain.  Patient reports 2 days ago she stopped breast-feeding abruptly.  States she has been having engorgement and pain and tenderness but denies any redness, warmth, fevers or chills.  This is her second child and states she did not have this issue after her first child.  She has been doing warm compresses without improvement.  No history of mastitis.  No other concerns at this time.  HPI  Past Medical History:  Diagnosis Date   Gallstones    Headache    History of prior pregnancies    patient has 2 daughters   Pregnancy induced hypertension     Patient Active Problem List   Diagnosis Date Noted   TB lung, latent 07/27/2023   History of proteinuria syndrome 05/12/2022   Normal labor 05/12/2022   S/P laparoscopic cholecystectomy 01/07/2022   Cholecystolithiasis complicating pregnancy in second trimester 01/06/2022   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    Past Surgical History:  Procedure Laterality Date   APPENDECTOMY     CHOLECYSTECTOMY N/A 01/06/2022   Procedure: LAPAROSCOPIC CHOLECYSTECTOMY;  Surgeon: Emelia Loron, MD;  Location: [facility] OR;  Service: General;  Laterality: N/A;    OB History     Gravida  4   Para  2   Term  2   Preterm      AB  2   Living  2      SAB  2   IAB  0   Ectopic      Multiple  0   Live Births  2        Obstetric Comments  Pre-e  2020          Home Medications    Prior to Admission medications   Medication Sig Start Date End Date Taking? Authorizing Provider  ibuprofen  (ADVIL) 800 MG tablet Take 1 tablet (800 mg total) by mouth 3 (three) times daily. 10/28/23   Rising, Lurena Joiner, PA-C  isoniazid (NYDRAZID) 300 MG tablet Take 1 tablet (300 mg total) by mouth daily. Patient not taking: Reported on 01/08/2024 07/27/23   Gardiner Barefoot, MD  Prenatal Vit-Fe Fumarate-FA (MULTIVITAMIN-PRENATAL) 27-0.8 MG TABS tablet Take 1 tablet by mouth daily at 12 noon.    [provider]  pyridOXINE (B-6) 50 MG tablet Take 1 tablet (50 mg total) by mouth daily. Patient not taking: Reported on 01/08/2024 07/27/23   Gardiner Barefoot, MD  vitamin C (ASCORBIC ACID) 250 MG tablet Take 250 mg by mouth 2 (two) times daily.    [provider]    Family History Family History  Problem Relation Age of Onset   Cancer Mother        leukemia   Diabetes Father    Anemia Sister    Hypertension Brother     Social History Social History   Tobacco Use   Smoking status: Never   Smokeless tobacco: Never  Vaping Use   Vaping status: Never Used  Substance Use Topics  Alcohol use: Never   Drug use: Never     Allergies   Patient has no known allergies.   Review of Systems Review of Systems  Skin:        Breast pain     Physical Exam Triage Vital Signs ED Triage Vitals  Encounter Vitals Group     BP 02/13/24 1555 106/72     Systolic BP Percentile --      Diastolic BP Percentile --      Pulse Rate 02/13/24 1555 88     Resp 02/13/24 1555 17     Temp 02/13/24 1555 97.6 F (36.4 C)     Temp Source 02/13/24 1555 Oral     SpO2 02/13/24 1555 99 %     Weight --      Height --      Head Circumference --      Peak Flow --      Pain Score 02/13/24 1554 6     Pain Loc --      Pain Education --      Exclude from Growth Chart --    No data found.  Updated Vital Signs BP 106/72 (BP Location: Right Arm)   Pulse 88   Temp 97.6 F (36.4 C) (Oral)   Resp 17   LMP 02/10/2024 (Exact Date)   SpO2 99%   Breastfeeding No   Visual Acuity Right Eye Distance:    Left Eye Distance:   Bilateral Distance:    Right Eye Near:   Left Eye Near:    Bilateral Near:     Physical Exam Vitals and nursing note reviewed. Exam conducted with a chaperone present Insurance account manager).  Constitutional:      General: She is not in acute distress.    Appearance: Normal appearance. She is not ill-appearing.  HENT:     Head: Normocephalic and atraumatic.  Eyes:     Pupils: Pupils are equal, round, and reactive to light.  Cardiovascular:     Rate and Rhythm: Normal rate.  Pulmonary:     Effort: Pulmonary effort is normal.  Chest:  Breasts:    Right: Swelling and tenderness present. No inverted nipple, mass or nipple discharge.     Left: Normal.     Comments: There is breast engorgement of the right without erythema warmth.  No nipple discharge or inversion. Skin:    General: Skin is warm and dry.  Neurological:     General: No focal deficit present.     Mental Status: She is alert and oriented to person, place, and time.  Psychiatric:        Mood and Affect: Mood normal.        Behavior: Behavior normal.      UC Treatments / Results  Labs (all labs ordered are listed, but only abnormal results are displayed) Labs Reviewed - No data to display  EKG   Radiology No results found.  Procedures Procedures (including critical care time)  Medications Ordered in UC Medications - No data to display  Initial Impression / Assessment and Plan / UC Course  I have reviewed the triage vital signs and the nursing notes.  Pertinent labs & imaging results that were available during my care of the patient were reviewed by me and considered in my medical decision making (see chart for details).     Reviewed exam and symptoms with patient.  No red flags.  No signs of mastitis but did discuss engorgement as she  abruptly stopped breast-feeding 2 days ago.  Discussed symptomatic treatment with cool compresses and OTC analgesics.  Advised follow-up with her OB  tomorrow for further recommendations/treatment.  ER precautions reviewed and she verbalized understanding. Final Clinical Impressions(s) / UC Diagnoses   Final diagnoses:  Milk engorgement of breast     Discharge Instructions      Do cool compresses to the breast to help with pain and swelling and continue Tylenol and ibuprofen.  With your obstetrician tomorrow for further recommendations/treatment.  Please go to the ER if you develop any worsening symptoms.  Hope you feel better soon!     ED Prescriptions   None    PDMP not reviewed this encounter.   Radford Pax, NP 02/13/24 1612    Radford Pax, NP 02/13/24 [phone]

## 2024-02-13 NOTE — Discharge Instructions (Addendum)
 Do cool compresses to the breast to help with pain and swelling and continue Tylenol and ibuprofen.  With your obstetrician tomorrow for further recommendations/treatment.  Please go to the ER if you develop any worsening symptoms.  Hope you feel better soon!

## 2024-04-06 ENCOUNTER — Ambulatory Visit
Admission: RE | Admit: 2024-04-06 | Discharge: 2024-04-06 | Disposition: A | Discharge status: Home / Self Care | Source: Ambulatory Visit | Attending: Family Medicine | Admitting: Family Medicine

## 2024-04-06 VITALS — BP 101/69 | HR 83 | Temp 98.3°F | Resp 16

## 2024-04-06 DIAGNOSIS — M545 Low back pain, unspecified: Secondary | ICD-10-CM | POA: Diagnosis not present

## 2024-04-06 DIAGNOSIS — R110 Nausea: Secondary | ICD-10-CM | POA: Diagnosis not present

## 2024-04-06 DIAGNOSIS — Z3201 Encounter for pregnancy test, result positive: Secondary | ICD-10-CM | POA: Diagnosis not present

## 2024-04-06 LAB — POCT URINE PREGNANCY: Preg Test, Ur: POSITIVE — AB

## 2024-04-06 LAB — POCT URINALYSIS DIP (MANUAL ENTRY)
Bilirubin, UA: NEGATIVE
Blood, UA: NEGATIVE
Glucose, UA: NEGATIVE mg/dL
Ketones, POC UA: NEGATIVE mg/dL
Nitrite, UA: NEGATIVE
Protein Ur, POC: NEGATIVE mg/dL
Spec Grav, UA: 1.01 (ref 1.010–1.025)
Urobilinogen, UA: 0.2 U/dL
pH, UA: 6.5 (ref 5.0–8.0)

## 2024-04-06 MED ORDER — ACETAMINOPHEN 325 MG PO TABS
650.0000 mg | ORAL_TABLET | Freq: Four times a day (QID) | ORAL | 0 refills | Status: AC | PRN
Start: 2024-04-06 — End: ?

## 2024-04-06 MED ORDER — DOXYLAMINE-PYRIDOXINE 10-10 MG PO TBEC: 1.0000 | DELAYED_RELEASE_TABLET | Freq: Two times a day (BID) | ORAL | 0 refills | Status: DC | PRN

## 2024-04-06 NOTE — Discharge Instructions (Signed)
 Your pregnancy test was positive and therefore I recommend seeking a consultation with an obstetrician.  If you intend on keeping the pregnancy, please start a prenatal vitamin.  Avoid any alcohol use.  Do not use any nonsteroidal anti-inflammatories (NSAIDs) like ibuprofen , Motrin , naproxen, Aleve, etc. which are all available over-the-counter.  Please just use Tylenol  at a dose of 500mg -650mg  once every 6 hours as needed for your aches, pains, fevers. You can use cetirizine  for your allergies. Use Diclegis 1-2 times daily as needed for nausea and vomiting in pregnancy.

## 2024-04-06 NOTE — ED Provider Notes (Signed)
 [facility]  Note:  This document was prepared using Conservation officer, historic buildings and may include unintentional dictation errors.  MRN: [id number] DOB: [birth year]  Subjective:   Angela Russo is a 33 y.o. female presenting for 5-day history of acute onset abdominal bloating, nausea without vomiting, mild intermittent low back pain.  Patient has used some Tylenol  and ibuprofen .  Would like to have a pregnancy test.  LMP was 1 month ago.  No fever, vaginal bleeding, dysuria, hematuria, chest pain, shortness of breath, falls, trauma.  Takes cetirizine  for allergies.    No Known Allergies  Past Medical History:  Diagnosis Date   Gallstones    Headache    History of prior pregnancies    patient has 2 daughters   Pregnancy induced hypertension      Past Surgical History:  Procedure Laterality Date   APPENDECTOMY     CHOLECYSTECTOMY N/A 01/06/2022   Procedure: LAPAROSCOPIC CHOLECYSTECTOMY;  Surgeon: Enid Harry, MD;  Location: [facility] OR;  Service: General;  Laterality: N/A;    Family History  Problem Relation Age of Onset   Cancer Mother        leukemia   Diabetes Father    Anemia Sister    Hypertension Brother     Social History   Tobacco Use   Smoking status: Never   Smokeless tobacco: Never  Vaping Use   Vaping status: Never Used  Substance Use Topics   Alcohol use: Never   Drug use: Never    ROS   Objective:   Vitals: BP 101/69 (BP Location: Right Arm)   Pulse 83   Temp 98.3 F (36.8 C) (Oral)   Resp 16   LMP 03/07/2024 (Exact Date)   SpO2 99%   Breastfeeding No   Physical Exam Constitutional:      General: She is not in acute distress.    Appearance: Normal appearance. She is well-developed. She is not ill-appearing, toxic-appearing or diaphoretic.  HENT:     Head: Normocephalic and atraumatic.     Nose: Nose normal.     Mouth/Throat:     Mouth: Mucous membranes are moist.     Pharynx: No oropharyngeal exudate  or posterior oropharyngeal erythema.  Eyes:     General: No scleral icterus.       Right eye: No discharge.        Left eye: No discharge.     Extraocular Movements: Extraocular movements intact.     Conjunctiva/sclera: Conjunctivae normal.  Cardiovascular:     Rate and Rhythm: Normal rate.  Pulmonary:     Effort: Pulmonary effort is normal.  Abdominal:     General: Bowel sounds are normal. There is no distension.     Palpations: Abdomen is soft. There is no mass.     Tenderness: There is no abdominal tenderness. There is no right CVA tenderness, left CVA tenderness, guarding or rebound.  Musculoskeletal:     Lumbar back: No swelling, edema, deformity, signs of trauma, lacerations, spasms, tenderness or bony tenderness. Normal range of motion. Negative right straight leg raise test and negative left straight leg raise test. No scoliosis.  Skin:    General: Skin is warm and dry.  Neurological:     General: No focal deficit present.     Mental Status: She is alert and oriented to person, place, and time.     Cranial Nerves: No cranial nerve deficit.     Motor: No weakness.     Coordination:  Coordination normal.     Gait: Gait normal.     Deep Tendon Reflexes: Reflexes normal.  Psychiatric:        Mood and Affect: Mood normal.        Behavior: Behavior normal.        Thought Content: Thought content normal.        Judgment: Judgment normal.     Results for orders placed or performed during the hospital encounter of 04/06/24 (from the past 24 hours)  POCT urinalysis dipstick     Status: Abnormal   Collection Time: 04/06/24  2:31 PM  Result Value Ref Range   Color, UA yellow yellow   Clarity, UA clear clear   Glucose, UA negative negative mg/dL   Bilirubin, UA negative negative   Ketones, POC UA negative negative mg/dL   Spec Grav, UA 8.295 6.213 - 1.025   Blood, UA negative negative   pH, UA 6.5 5.0 - 8.0   Protein Ur, POC negative negative mg/dL   Urobilinogen, UA 0.2 0.2  or 1.0 E.U./dL   Nitrite, UA Negative Negative   Leukocytes, UA Small (1+) (A) Negative  POCT urine pregnancy     Status: Abnormal   Collection Time: 04/06/24  2:31 PM  Result Value Ref Range   Preg Test, Ur Positive (A) Negative    Assessment and Plan :   PDMP not reviewed this encounter.  1. Positive pregnancy test   2. Nausea without vomiting   3. Acute bilateral low back pain without sciatica    Anticipatory guidance provided to patient regarding prenatal care.  She is to start a prenatal vitamin and counseled on avoiding alcohol, cigarettes, drug use, caffeine.  Also counseled on medications that are safe in pregnancy over-the-counter.  Can use Diclegis for nausea and vomiting in pregnancy.  Counseled patient on potential for adverse effects with medications prescribed/recommended today, ER and return-to-clinic precautions discussed, patient verbalized understanding.    Adolph Hoop, [place] 04/06/24 1458

## 2024-04-06 NOTE — ED Triage Notes (Addendum)
 Pt states abdominal bloating and lower back pain for the past 5 days. States some lose of appetite and nausea. Pt states she thinks she might be pregnant.

## 2024-04-09 ENCOUNTER — Inpatient Hospital Stay (HOSPITAL_COMMUNITY)
Admission: AD | Admit: 2024-04-09 | Discharge: 2024-04-09 | Disposition: A | Discharge status: Home / Self Care | Attending: Obstetrics and Gynecology | Admitting: Obstetrics and Gynecology

## 2024-04-09 ENCOUNTER — Encounter (HOSPITAL_COMMUNITY): Payer: Self-pay | Admitting: *Deleted

## 2024-04-09 ENCOUNTER — Inpatient Hospital Stay (HOSPITAL_COMMUNITY)

## 2024-04-09 DIAGNOSIS — R109 Unspecified abdominal pain: Secondary | ICD-10-CM | POA: Insufficient documentation

## 2024-04-09 DIAGNOSIS — O3680X0 Pregnancy with inconclusive fetal viability, not applicable or unspecified: Secondary | ICD-10-CM | POA: Diagnosis not present

## 2024-04-09 DIAGNOSIS — M545 Low back pain, unspecified: Secondary | ICD-10-CM | POA: Diagnosis not present

## 2024-04-09 DIAGNOSIS — O26891 Other specified pregnancy related conditions, first trimester: Secondary | ICD-10-CM | POA: Diagnosis present

## 2024-04-09 DIAGNOSIS — M549 Dorsalgia, unspecified: Secondary | ICD-10-CM | POA: Diagnosis present

## 2024-04-09 DIAGNOSIS — Z3A01 Less than 8 weeks gestation of pregnancy: Secondary | ICD-10-CM

## 2024-04-09 HISTORY — DX: Urinary tract infection, site not specified: N39.0

## 2024-04-09 LAB — URINALYSIS, ROUTINE W REFLEX MICROSCOPIC
Bilirubin Urine: NEGATIVE
Glucose, UA: NEGATIVE mg/dL
Hgb urine dipstick: NEGATIVE
Ketones, ur: NEGATIVE mg/dL
Leukocytes,Ua: NEGATIVE
Nitrite: NEGATIVE
Protein, ur: NEGATIVE mg/dL
Specific Gravity, Urine: 1.009 (ref 1.005–1.030)
pH: 7 (ref 5.0–8.0)

## 2024-04-09 LAB — WET PREP, GENITAL
Clue Cells Wet Prep HPF POC: NONE SEEN
Sperm: NONE SEEN
Trich, Wet Prep: NONE SEEN
WBC, Wet Prep HPF POC: <10 (ref <10)

## 2024-04-09 LAB — CBC
HCT: 39 % (ref 36.0–46.0)
Hemoglobin: 13.3 g/dL (ref 12.0–15.0)
MCH: 30.4 pg (ref 26.0–34.0)
MCHC: 34.1 g/dL (ref 30.0–36.0)
MCV: 89 fL (ref 80.0–100.0)
Platelets: 273 10*3/uL (ref 150–400)
RBC: 4.38 MIL/uL (ref 3.87–5.11)
RDW: 12.4 % (ref 11.5–15.5)
WBC: 7.4 10*3/uL (ref 4.0–10.5)
nRBC: 0 % (ref 0.0–0.2)

## 2024-04-09 LAB — HCG, QUANTITATIVE, PREGNANCY: hCG, Beta Chain, Quant, S: 777 m[IU]/mL — ABNORMAL HIGH (ref <5)

## 2024-04-09 NOTE — Discharge Instructions (Addendum)
 We will need to recheck your blood work at the office in 3 days. An appointment was scheduled for you at 10:35am on Apr 12, 2024.  Reasons to return to MAU at [facility]: If you have heavier bleeding that soaks through more that 2 pads per hour for an hour or more If you bleed so much that you feel like you might pass out or you do pass out If you have significant abdominal pain that is not improved with Tylenol  1000 mg every 8 hours as needed for pain If you develop a fever > 100.5    SAFE MEDICATIONS IN PREGNANCY   Acne:  Benzoyl Peroxide  Salicylic Acid   Backache/Headache:  Tylenol : 2 regular strength every 4 hours OR               2 Extra strength every 6 hours   Colds/Coughs/Allergies:  Benadryl  (alcohol free) 25 mg every 6 hours as needed  Breath right strips  Claritin  Cepacol throat lozenges  Chloraseptic throat spray  Cold-Eeze- up to three times per day  Cough drops, alcohol free  Flonase (by prescription only)  Guaifenesin  Mucinex  Robitussin DM (plain only, alcohol free)  Saline nasal spray/drops  Sudafed (pseudoephedrine) & Actifed * use only after [redacted] weeks gestation and if you do not have high blood pressure  Tylenol   Vicks Vaporub  Zinc lozenges  Zyrtec    Constipation:  Colace  Ducolax suppositories  Fleet enema  Glycerin  suppositories  Metamucil  Milk of magnesia  Miralax  Senokot  Smooth move tea   Diarrhea:  Kaopectate  Imodium A-D   *NO pepto Bismol   Hemorrhoids:  Anusol  Anusol HC  Preparation H  Tucks   Indigestion:  Tums  Maalox  Mylanta  Zantac  Pepcid    Insomnia:  Benadryl  (alcohol free) 25mg  every 6 hours as needed  Tylenol  PM  Unisom, no Gelcaps   Leg Cramps:  Tums  MagGel   Nausea/Vomiting:  Bonine  Dramamine  Emetrol  Ginger extract  Sea bands  Meclizine  Nausea medication to take during pregnancy:  Unisom (doxylamine succinate 25 mg tablets) Take one tablet daily at  bedtime. If symptoms are not adequately controlled, the dose can be increased to a maximum recommended dose of two tablets daily (1/2 tablet in the morning, 1/2 tablet mid-afternoon and one at bedtime).  Vitamin B6 100mg  tablets. Take one tablet twice a day (up to 200 mg per day).   Skin Rashes:  Aveeno products  Benadryl  cream or 25mg  every 6 hours as needed  Calamine Lotion  1% cortisone cream   Yeast infection:  Gyne-lotrimin 7  Monistat 7    **If taking multiple medications, please check labels to avoid duplicating the same active ingredients  **take medication as directed on the label  ** Do not exceed 4000 mg of tylenol  in 24 hours  **Do not take medications that contain aspirin or ibuprofen 

## 2024-04-09 NOTE — MAU Note (Signed)
 Angela Russo is a 33 y.o. at [redacted]w[redacted]d here in MAU reporting: having pain in lower back, started about 6/7 days ago.  Went to UC over the weekend, that is when she found out she was pregnant. Was instructed to take  Tylenol , it helps, but returns.  Started having pain in lower abd yesterday.  LMP: 3/25 Onset of complaint: 6/7 days Pain score: back 6, abd 5 Vitals:   04/09/24 1653  BP: 107/63  Pulse: 83  Resp: 15  Temp: 98.3 F (36.8 C)  SpO2: 100%      Lab orders placed from triage:  urine, vag swabs

## 2024-04-09 NOTE — MAU Provider Note (Signed)
 Chief Complaint:  Abdominal Pain and Back Pain   HPI   None     Charnelle Alberti is a 33 y.o. [id number] at [redacted]w[redacted]d who presents to maternity admissions reporting 6/10 low back pain. Pain started about 7 days ago and has been fairly constant. She went to urgent care for the back pain on 04/07/2023 with associated abdominal bloating and nausea requesting a pregnancy test which was positive at that time. Pain is alleviated with Tylenol . She also started having 5/10 abdominal pain yesterday.  Pregnancy Course: Positive pregnancy test 04/06/2024 at [facility]. She is established at [facility].  Past Medical History:  Diagnosis Date   Gallstones    Headache    History of prior pregnancies    patient has 2 daughters   Pregnancy induced hypertension    UTI (urinary tract infection)    OB History  Gravida Para Term Preterm AB Living  5 2 2  2 2   SAB IAB Ectopic Multiple Live Births  2 0  0 2    # Outcome Date GA Lbr Len/2nd Weight Sex Type Anes PTL Lv  5 Current           4 Term 05/12/22 [redacted]w[redacted]d  2835 g F Vag-Spont EPI  LIV  3 Term 09/17/19 [redacted]w[redacted]d 10:45 / 02:08 2835 g F Vag-Spont EPI  LIV  2 SAB 2018          1 SAB 2018            Obstetric Comments  Pre-e  2020   Past Surgical History:  Procedure Laterality Date   APPENDECTOMY     CHOLECYSTECTOMY N/A 01/06/2022   Procedure: LAPAROSCOPIC CHOLECYSTECTOMY;  Surgeon: Enid Harry, MD;  Location: [facility] OR;  Service: General;  Laterality: N/A;   Family History  Problem Relation Age of Onset   Cancer Mother        leukemia   Diabetes Father    Anemia Sister    Hypertension Brother    Social History   Tobacco Use   Smoking status: Never   Smokeless tobacco: Never  Vaping Use   Vaping status: Never Used  Substance Use Topics   Alcohol use: Never   Drug use: Never   No Known Allergies Medications Prior to Admission  Medication Sig Dispense Refill Last Dose/Taking   acetaminophen  (TYLENOL ) 325 MG tablet Take 2 tablets (650 mg total) by mouth  every 6 (six) hours as needed for moderate pain (pain score 4-6). 30 tablet 0 04/09/2024 at 12:00 PM   cetirizine  (ZYRTEC ) 10 MG chewable tablet Chew 10 mg by mouth daily.   04/08/2024   Doxylamine-Pyridoxine  (DICLEGIS) 10-10 MG TBEC Take 1 tablet by mouth 2 (two) times daily as needed. 60 tablet 0 04/09/2024 at  9:30 AM   ESTARYLLA 0.25-35 MG-MCG tablet Take 1 tablet by mouth daily.   Taking   Prenatal Vit-Fe Fumarate-FA (MULTIVITAMIN-PRENATAL) 27-0.8 MG TABS tablet Take 1 tablet by mouth daily at 12 noon.   04/08/2024 Evening    I have reviewed patient's Past Medical Hx, Surgical Hx, Family Hx, Social Hx, medications and allergies.   ROS  Pertinent items noted in HPI and remainder of comprehensive ROS otherwise negative.   PHYSICAL EXAM  Patient Vitals for the past 24 hrs:  BP Temp Temp src Pulse Resp SpO2 Height Weight  04/09/24 1653 107/63 98.3 F (36.8 C) Oral 83 15 100 % 5\' 2"  (1.575 m) 52.3 kg    Constitutional: Well-developed, well-nourished female in no acute distress.  HEENT:  atraumatic, normocephalic. Neck has normal ROM. EOM intact. Cardiovascular: normal rate & rhythm, warm and well-perfused Respiratory: normal effort, no problems with respiration noted GI: Abd soft, non-tender, non-distended MSK: Extremities nontender, no edema, normal ROM Skin: warm and dry. Acyanotic, no jaundice or pallor. Neurologic: Alert and oriented x 4. No abnormal coordination. Psychiatric: Normal mood. Speech not slurred, not rapid/pressured. Patient is cooperative. GU: no CVA tenderness  Labs: Results for orders placed or performed during the hospital encounter of 04/09/24 (from the past 24 hours)  Urinalysis, Routine w reflex microscopic -Urine, Clean Catch     Status: Abnormal   Collection Time: 04/09/24  5:03 PM  Result Value Ref Range   Color, Urine YELLOW YELLOW   APPearance HAZY (A) CLEAR   Specific Gravity, Urine 1.009 1.005 - 1.030   pH 7.0 5.0 - 8.0   Glucose, UA NEGATIVE NEGATIVE  mg/dL   Hgb urine dipstick NEGATIVE NEGATIVE   Bilirubin Urine NEGATIVE NEGATIVE   Ketones, ur NEGATIVE NEGATIVE mg/dL   Protein, ur NEGATIVE NEGATIVE mg/dL   Nitrite NEGATIVE NEGATIVE   Leukocytes,Ua NEGATIVE NEGATIVE  Wet prep, genital     Status: Abnormal   Collection Time: 04/09/24  5:03 PM   Specimen: Urine, Clean Catch  Result Value Ref Range   Yeast Wet Prep HPF POC PRESENT (A) NONE SEEN   Trich, Wet Prep NONE SEEN NONE SEEN   Clue Cells Wet Prep HPF POC NONE SEEN NONE SEEN   WBC, Wet Prep HPF POC <10 <10   Sperm NONE SEEN   CBC     Status: None   Collection Time: 04/09/24  5:19 PM  Result Value Ref Range   WBC 7.4 4.0 - 10.5 K/uL   RBC 4.38 3.87 - 5.11 MIL/uL   Hemoglobin 13.3 12.0 - 15.0 g/dL   HCT 09.8 11.9 - 14.7 %   MCV 89.0 80.0 - 100.0 fL   MCH 30.4 26.0 - 34.0 pg   MCHC 34.1 30.0 - 36.0 g/dL   RDW 82.9 56.2 - 13.0 %   Platelets 273 150 - 400 K/uL   nRBC 0.0 0.0 - 0.2 %  hCG, quantitative, pregnancy     Status: Abnormal   Collection Time: 04/09/24  5:19 PM  Result Value Ref Range   hCG, Beta Chain, Quant, S 777 (H) <5 mIU/mL    Imaging:  US  OB LESS THAN 14 WEEKS WITH OB TRANSVAGINAL Result Date: 04/09/2024 CLINICAL DATA:  [id number] Abdominal pain during pregnancy in first trimester [id number] EXAM: OBSTETRIC <14 WK US  AND TRANSVAGINAL OB US  TECHNIQUE: Both transabdominal and transvaginal ultrasound examinations were performed for complete evaluation of the gestation as well as the maternal uterus, adnexal regions, and pelvic cul-de-sac. Transvaginal technique was performed to assess early pregnancy. COMPARISON:  None Available. FINDINGS: Intrauterine gestational sac: Cystic structure in the upper endometrium measuring 4 x 3 x 4 mm. Yolk sac:  Not present, at this time Embryo:  Not present, at this time Cardiac Activity: Not present, at this time MSD: 3.6 mm   5 w   1 d Subchorionic hemorrhage:  None visualized. Maternal uterus/adnexae: The ovaries are not enlarged. The  right ovary measures 3 x 1.7 x 2.2 cm. The left ovary measures 3 x 2.1 x 2.2 cm. Likely corpus luteum in the left ovary measuring 1.9 cm. Trace free fluid in the pelvis. IMPRESSION: Small cystic structure in the upper endometrium measuring 4 x 3 x 4 mm. No yolk sac or fetal pole present, at this time,  possibly due to the earliness of the gestation. While this could represent an early gestational sac, a pseudogestational sac in an unseen ectopic pregnancy remains in the differential. Serial beta hCG and sonographic follow-up in 14 days is recommended. Electronically Signed   By: Rance Burrows M.D.   On: 04/09/2024 17:42    MDM & MAU COURSE  MDM: Moderate  MAU Course: -[place] workup for ectopic rule out. Blood type B+ already on file. -CBC and UA unremarkable. -US  with possible early gestational sac vs pseudogestational sac in upper endometrium.  -beta hCG 777, consistent with around [redacted] weeks gestation.  Differential diagnosis considered for 1st trimester vaginal bleeding includes but is not limited to: ectopic pregnancy, complete spontaneous abortion, incomplete abortion, missed abortion, threatened abortion, embryonic/fetal demise, cervical insufficiency, cervical or vaginal disorder   Orders Placed This Encounter  Procedures   Wet prep, genital   US  OB LESS THAN 14 WEEKS WITH OB TRANSVAGINAL   Urinalysis, Routine w reflex microscopic -Urine, Clean Catch   CBC   hCG, quantitative, pregnancy   Discharge patient   No orders of the defined types were placed in this encounter.   ASSESSMENT   1. Abdominal pain during pregnancy in first trimester   2. Pregnancy of unknown anatomic location   3. [redacted] weeks gestation of pregnancy     PLAN  Discharge home in stable condition with ectopic return precautions.  Recommend repeat beta hCG in 48 hours with possible repeat US  in 14 days depending on beta trend.  May take Tylenol  for abdominal and lower back pain or Diclegis for nausea. Discussed  plan for repeat beta hCG to determine whether this is an intrauterine vs ectopic pregnancy. Patient verbalized understanding.   Follow-up Information     [facility] Follow up in 3 day(s).   Specialty: Obstetrics and Gynecology Why: for repeat blood work. 10:35am on 04/12/2024.  Call if you need to change the appointment time that was made for you. Contact information: [address] [place]  [address] [phone]                 Allergies as of 04/09/2024   No Known Allergies      Medication List     STOP taking these medications    Estarylla 0.25-35 MG-MCG tablet Generic drug: norgestimate-ethinyl estradiol       TAKE these medications    acetaminophen  325 MG tablet Commonly known as: Tylenol  Take 2 tablets (650 mg total) by mouth every 6 (six) hours as needed for moderate pain (pain score 4-6).   cetirizine  10 MG chewable tablet Commonly known as: ZYRTEC  Chew 10 mg by mouth daily.   Doxylamine-Pyridoxine  10-10 MG Tbec Commonly known as: Diclegis Take 1 tablet by mouth 2 (two) times daily as needed.   multivitamin-prenatal 27-0.8 MG Tabs tablet Take 1 tablet by mouth daily at 12 noon.        Noreene Bearded, PA

## 2024-04-10 LAB — GC/CHLAMYDIA PROBE AMP (~~LOC~~) NOT AT ARMC
Chlamydia: NEGATIVE
Comment: NEGATIVE
Comment: NORMAL
Neisseria Gonorrhea: NEGATIVE

## 2024-04-12 ENCOUNTER — Inpatient Hospital Stay (HOSPITAL_COMMUNITY)

## 2024-04-12 ENCOUNTER — Telehealth: Payer: Self-pay | Admitting: Obstetrics and Gynecology

## 2024-04-12 ENCOUNTER — Inpatient Hospital Stay (HOSPITAL_COMMUNITY)
Admission: AD | Admit: 2024-04-12 | Discharge: 2024-04-13 | Disposition: A | Discharge status: Home / Self Care | Payer: Self-pay | Attending: Obstetrics & Gynecology | Admitting: Obstetrics & Gynecology

## 2024-04-12 ENCOUNTER — Ambulatory Visit: Payer: Self-pay | Admitting: General Practice

## 2024-04-12 ENCOUNTER — Encounter: Payer: Self-pay | Admitting: General Practice

## 2024-04-12 ENCOUNTER — Other Ambulatory Visit: Payer: Self-pay

## 2024-04-12 VITALS — BP 102/69 | HR 93 | Ht 62.0 in | Wt 114.0 lb

## 2024-04-12 DIAGNOSIS — Z3A01 Less than 8 weeks gestation of pregnancy: Secondary | ICD-10-CM

## 2024-04-12 DIAGNOSIS — O008 Other ectopic pregnancy without intrauterine pregnancy: Secondary | ICD-10-CM

## 2024-04-12 DIAGNOSIS — R109 Unspecified abdominal pain: Secondary | ICD-10-CM | POA: Diagnosis present

## 2024-04-12 DIAGNOSIS — O0281 Inappropriate change in quantitative human chorionic gonadotropin (hCG) in early pregnancy: Secondary | ICD-10-CM | POA: Insufficient documentation

## 2024-04-12 DIAGNOSIS — O3680X0 Pregnancy with inconclusive fetal viability, not applicable or unspecified: Secondary | ICD-10-CM

## 2024-04-12 LAB — CBC
HCT: 35.5 % — ABNORMAL LOW (ref 36.0–46.0)
Hemoglobin: 12.1 g/dL (ref 12.0–15.0)
MCH: 30 pg (ref 26.0–34.0)
MCHC: 34.1 g/dL (ref 30.0–36.0)
MCV: 88.1 fL (ref 80.0–100.0)
Platelets: 246 10*3/uL (ref 150–400)
RBC: 4.03 MIL/uL (ref 3.87–5.11)
RDW: 12.2 % (ref 11.5–15.5)
WBC: 6.4 10*3/uL (ref 4.0–10.5)
nRBC: 0 % (ref 0.0–0.2)

## 2024-04-12 LAB — COMPREHENSIVE METABOLIC PANEL WITH GFR
ALT: 17 U/L (ref 0–44)
AST: 20 U/L (ref 15–41)
Albumin: 3.9 g/dL (ref 3.5–5.0)
Alkaline Phosphatase: 43 U/L (ref 38–126)
Anion gap: 7 (ref 5–15)
BUN: <5 mg/dL — ABNORMAL LOW (ref 6–20)
CO2: 25 mmol/L (ref 22–32)
Calcium: 9 mg/dL (ref 8.9–10.3)
Chloride: 105 mmol/L (ref 98–111)
Creatinine, Ser: 0.55 mg/dL (ref 0.44–1.00)
GFR, Estimated: >60 mL/min (ref >60)
Glucose, Bld: 104 mg/dL — ABNORMAL HIGH (ref 70–99)
Potassium: 3.6 mmol/L (ref 3.5–5.1)
Sodium: 137 mmol/L (ref 135–145)
Total Bilirubin: 0.6 mg/dL (ref 0.0–1.2)
Total Protein: 6.2 g/dL — ABNORMAL LOW (ref 6.5–8.1)

## 2024-04-12 LAB — HCG, QUANTITATIVE, PREGNANCY: hCG, Beta Chain, Quant, S: 891 m[IU]/mL — ABNORMAL HIGH (ref <5)

## 2024-04-12 LAB — BETA HCG QUANT (REF LAB): hCG Quant: 735 m[IU]/mL

## 2024-04-12 MED ORDER — METHOTREXATE FOR ECTOPIC PREGNANCY
50.0000 mg/m2 | Freq: Once | INTRAMUSCULAR | Status: AC
  Administered 2024-04-12: 75 mg via INTRAMUSCULAR
  Filled 2024-04-12: qty 3

## 2024-04-12 NOTE — Progress Notes (Signed)
 Beta HCG Follow-up Visit  Angela Russo presents to [facility] for follow-up beta HCG lab. She was seen in MAU for  low back pain  on 4/29. Patient reports  continued mild back pain  today managed by tylenol - no bleeding. Discussed with patient that we are following beta HCG levels today. Results will be back in approximately 2 hours. Valid contact number for patient confirmed.   Beta HCG results:          4/29         777         Chart sent to Dr Alto Atta for follow up of results as they will be back after the office is closed.  Angela Russo 04/12/2024 10:31 AM

## 2024-04-12 NOTE — MAU Provider Note (Signed)
 History     CSN: [id number]  Arrival date and time: 04/12/24 1635   Event Date/Time   First Provider Initiated Contact with Patient 04/12/24 2234      Chief Complaint  Patient presents with   Ectopic Pregnancy   Angela Russo is a 33 y.o. [id number] at [redacted]w[redacted]d who receives care at [facility].  She presents today for follow up ultrasound.  Patient seen on 4/29 with hCG level of 777. Repeat today was 735 and 891 with consideration for lab to lab variance. Patient denies abdominal cramping or vaginal bleeding. No nausea or vomiting.  No issues with urination, constipation, or diarrhea.    OB History     Gravida  5   Para  2   Term  2   Preterm      AB  2   Living  2      SAB  2   IAB  0   Ectopic      Multiple  0   Live Births  2        Obstetric Comments  Pre-e  2020         Past Medical History:  Diagnosis Date   Gallstones    Headache    History of prior pregnancies    patient has 2 daughters   Pregnancy induced hypertension    UTI (urinary tract infection)     Past Surgical History:  Procedure Laterality Date   APPENDECTOMY     CHOLECYSTECTOMY N/A 01/06/2022   Procedure: LAPAROSCOPIC CHOLECYSTECTOMY;  Surgeon: Enid Harry, MD;  Location: [facility] OR;  Service: General;  Laterality: N/A;    Family History  Problem Relation Age of Onset   Cancer Mother        leukemia   Diabetes Father    Anemia Sister    Hypertension Brother     Social History   Tobacco Use   Smoking status: Never   Smokeless tobacco: Never  Vaping Use   Vaping status: Never Used  Substance Use Topics   Alcohol use: Never   Drug use: Never    Allergies: No Known Allergies  Medications Prior to Admission  Medication Sig Dispense Refill Last Dose/Taking   acetaminophen  (TYLENOL ) 325 MG tablet Take 2 tablets (650 mg total) by mouth every 6 (six) hours as needed for moderate pain (pain score 4-6). 30 tablet 0    cetirizine  (ZYRTEC ) 10 MG chewable tablet Chew 10 mg by  mouth daily.      Doxylamine -Pyridoxine  (DICLEGIS ) 10-10 MG TBEC Take 1 tablet by mouth 2 (two) times daily as needed. (Patient not taking: Reported on 04/12/2024) 60 tablet 0    Prenatal Vit-Fe Fumarate-FA (MULTIVITAMIN-PRENATAL) 27-0.8 MG TABS tablet Take 1 tablet by mouth daily at 12 noon.       Review of Systems  Gastrointestinal:  Negative for abdominal pain, nausea and vomiting.  Genitourinary:  Negative for difficulty urinating, dysuria, vaginal bleeding and vaginal discharge.   Physical Exam   Blood pressure 107/65, pulse 94, temperature 98.1 F (36.7 C), resp. rate 18, last menstrual period 03/07/2024, not currently breastfeeding.  Physical Exam Vitals and nursing note reviewed.  Constitutional:      Appearance: Normal appearance.  HENT:     Head: Normocephalic and atraumatic.  Eyes:     Conjunctiva/sclera: Conjunctivae normal.  Cardiovascular:     Rate and Rhythm: Normal rate.  Pulmonary:     Effort: Pulmonary effort is normal. No respiratory distress.  Musculoskeletal:     Cervical  back: Normal range of motion.  Skin:    General: Skin is warm and dry.  Neurological:     Mental Status: She is alert and oriented to person, place, and time.  Psychiatric:        Mood and Affect: Mood normal.        Behavior: Behavior normal.     MAU Course  Procedures Results for orders placed or performed during the hospital encounter of 04/12/24 (from the past 24 hours)  hCG, quantitative, pregnancy     Status: Abnormal   Collection Time: 04/12/24  5:02 PM  Result Value Ref Range   hCG, Beta Chain, Quant, S 891 (H) <5 mIU/mL  CBC     Status: Abnormal   Collection Time: 04/12/24  5:02 PM  Result Value Ref Range   WBC 6.4 4.0 - 10.5 K/uL   RBC 4.03 3.87 - 5.11 MIL/uL   Hemoglobin 12.1 12.0 - 15.0 g/dL   HCT 46.9 (L) 62.9 - 52.8 %   MCV 88.1 80.0 - 100.0 fL   MCH 30.0 26.0 - 34.0 pg   MCHC 34.1 30.0 - 36.0 g/dL   RDW 41.3 24.4 - 01.0 %   Platelets 246 150 - 400 K/uL   nRBC  0.0 0.0 - 0.2 %  Comprehensive metabolic panel     Status: Abnormal   Collection Time: 04/12/24  5:02 PM  Result Value Ref Range   Sodium 137 135 - 145 mmol/L   Potassium 3.6 3.5 - 5.1 mmol/L   Chloride 105 98 - 111 mmol/L   CO2 25 22 - 32 mmol/L   Glucose, Bld 104 (H) 70 - 99 mg/dL   BUN <5 (L) 6 - 20 mg/dL   Creatinine, Ser 2.72 0.44 - 1.00 mg/dL   Calcium  9.0 8.9 - 10.3 mg/dL   Total Protein 6.2 (L) 6.5 - 8.1 g/dL   Albumin 3.9 3.5 - 5.0 g/dL   AST 20 15 - 41 U/L   ALT 17 0 - 44 U/L   Alkaline Phosphatase 43 38 - 126 U/L   Total Bilirubin 0.6 0.0 - 1.2 mg/dL   GFR, Estimated >53 >66 mL/min   Anion gap 7 5 - 15   US  OB Transvaginal Result Date: 04/12/2024 CLINICAL DATA:  Inappropriate rise in quantitative beta HCG. Pregnancy of unknown location. EXAM: TRANSVAGINAL OB ULTRASOUND TECHNIQUE: Transvaginal ultrasound was performed for complete evaluation of the gestation as well as the maternal uterus, adnexal regions, and pelvic cul-de-sac. COMPARISON:  04/09/2024 FINDINGS: Intrauterine gestational sac: Single Yolk sac:  Not Visualized. Embryo:  Not Visualized. Cardiac Activity: Not Visualized. Heart Rate:  bpm MSD: 4.2 mm   5 w   1 d CRL:     mm    w  d                  US  EDC: Subchorionic hemorrhage:  None visualized. Maternal uterus/adnexae: No adnexal mass or free fluid. IMPRESSION: Again noted is possible small early intrauterine gestational sac, but pseudo gestational sac cannot be excluded. Recommend continued close follow-up with serial quantitative beta HCG and ultrasounds as indicated. Electronically Signed   By: Janeece Mechanic M.D.   On: 04/12/2024 20:24    Day 0/1 Day 4 Day 7  Sunday Wednesday Saturday  Monday Thursday Sunday  Tuesday Friday Monday  Wednesday Saturday Tuesday  Thursday Sunday Wednesday  Friday Monday Thursday  Saturday Tuesday Friday      MDM Labs: hCG, CMP, CBC Ultrasound Consult Methotrexate  Treatment  Coordination of Follow Up Assessment and Plan   33 year old, [id number]  Abnormal Rise in hCG No IUP  -Labs and US  return as above.  -Dr. Burrell Casa consulted and informed of patient status and results. Advised: *Agree with plan for methotrexate  dosing.  -Provider to bedside to discuss findings and recommendation for methotrexate .  -Reviewed Methotrexate  treatment including: *Risks of methotrexate  including failure requiring repeat dosing or eventual surgery.  *Side effects of photosensitivity & GI upset were discussed. Encouraged to avoid direct sunlight and abstain from alcohol and sexual intercourse for two weeks.  *Instruction to discontinue any MVI with folic acid. *Need for frequent return visits to monitor lab values *Risk for ectopic rupture until her beta is less than assay.  -After discussion, patient opts to proceed with methotrexate .   -Strict return/ectopic precautions reviewed.   She has no history of hepatic or renal dysfunction, has normal BUN/Cr/platelets.  She is felt to be reliable for follow-up.  -?She understands to follow up on D4 (Monday) and D7 (Thursday) for repeat BHCG and was the instruction sheet was placed in her AVS. -Patient desires to follow up with primary office.  -Dr. Daphene Dys contacted and secure message sent regarding patient need for follow up appt.  - Patient encouraged to call primary office or return to MAU if symptoms worsen or with the onset of new symptoms. -Discharged to home in stable condition after injection.  Kraig Peru MSN, CNM Advanced Practice Provider, [facility] 04/12/2024, 10:34 PM

## 2024-04-12 NOTE — MAU Note (Signed)
 Pt sent from office for poss ectopic pregnancy reports some cramping no bleeding  BP 107/65   Pulse 94   Temp 98.1 F (36.7 C)   Resp 18   LMP 03/07/2024 (Exact Date)

## 2024-04-12 NOTE — Telephone Encounter (Signed)
 Spoke with patient by phone regarding her HCG levels   Results for orders placed or performed in visit on 04/12/24  Beta hCG quant (ref lab)   Collection Time: 04/12/24 10:53 AM  Result Value Ref Range   hCG Quant 735 mIU/mL   HCG 4/29: 777  Patient denies vaginal bleeding Reports back pain  Recommend she report to MAU asap for rule out ectopic She verbalized her understanding She has someone to drive her and will go to the hospital now.  Marci Setter, MD, FACOG Obstetrician & Gynecologist, [facility] for [facility], [facility]

## 2024-06-23 ENCOUNTER — Encounter (HOSPITAL_COMMUNITY): Payer: Self-pay | Admitting: Obstetrics and Gynecology

## 2024-06-23 ENCOUNTER — Inpatient Hospital Stay (HOSPITAL_COMMUNITY)
Admission: AD | Admit: 2024-06-23 | Discharge: 2024-06-23 | Disposition: A | Discharge status: Home / Self Care | Attending: Obstetrics and Gynecology | Admitting: Obstetrics and Gynecology

## 2024-06-23 ENCOUNTER — Inpatient Hospital Stay (HOSPITAL_COMMUNITY)

## 2024-06-23 DIAGNOSIS — O208 Other hemorrhage in early pregnancy: Secondary | ICD-10-CM | POA: Insufficient documentation

## 2024-06-23 DIAGNOSIS — O200 Threatened abortion: Secondary | ICD-10-CM | POA: Insufficient documentation

## 2024-06-23 DIAGNOSIS — Z3A01 Less than 8 weeks gestation of pregnancy: Secondary | ICD-10-CM | POA: Diagnosis not present

## 2024-06-23 DIAGNOSIS — O418X90 Other specified disorders of amniotic fluid and membranes, unspecified trimester, not applicable or unspecified: Secondary | ICD-10-CM

## 2024-06-23 DIAGNOSIS — O23592 Infection of other part of genital tract in pregnancy, second trimester: Secondary | ICD-10-CM | POA: Diagnosis not present

## 2024-06-23 DIAGNOSIS — O4691 Antepartum hemorrhage, unspecified, first trimester: Secondary | ICD-10-CM | POA: Diagnosis not present

## 2024-06-23 DIAGNOSIS — O26891 Other specified pregnancy related conditions, first trimester: Secondary | ICD-10-CM | POA: Diagnosis present

## 2024-06-23 DIAGNOSIS — N898 Other specified noninflammatory disorders of vagina: Secondary | ICD-10-CM | POA: Diagnosis present

## 2024-06-23 DIAGNOSIS — B3731 Acute candidiasis of vulva and vagina: Secondary | ICD-10-CM | POA: Diagnosis not present

## 2024-06-23 DIAGNOSIS — O4690 Antepartum hemorrhage, unspecified, unspecified trimester: Secondary | ICD-10-CM

## 2024-06-23 LAB — CBC WITH DIFFERENTIAL/PLATELET
Abs Immature Granulocytes: 0.01 K/uL (ref 0.00–0.07)
Basophils Absolute: 0 K/uL (ref 0.0–0.1)
Basophils Relative: 1 %
Eosinophils Absolute: 0.1 K/uL (ref 0.0–0.5)
Eosinophils Relative: 1 %
HCT: 37.8 % (ref 36.0–46.0)
Hemoglobin: 13.1 g/dL (ref 12.0–15.0)
Immature Granulocytes: 0 %
Lymphocytes Relative: 35 %
Lymphs Abs: 2 K/uL (ref 0.7–4.0)
MCH: 30.6 pg (ref 26.0–34.0)
MCHC: 34.7 g/dL (ref 30.0–36.0)
MCV: 88.3 fL (ref 80.0–100.0)
Monocytes Absolute: 0.6 K/uL (ref 0.1–1.0)
Monocytes Relative: 10 %
Neutro Abs: 3 K/uL (ref 1.7–7.7)
Neutrophils Relative %: 53 %
Platelets: 278 K/uL (ref 150–400)
RBC: 4.28 MIL/uL (ref 3.87–5.11)
RDW: 12.1 % (ref 11.5–15.5)
WBC: 5.6 K/uL (ref 4.0–10.5)
nRBC: 0 % (ref 0.0–0.2)

## 2024-06-23 LAB — URINALYSIS, ROUTINE W REFLEX MICROSCOPIC
Bilirubin Urine: NEGATIVE
Glucose, UA: NEGATIVE mg/dL
Hgb urine dipstick: NEGATIVE
Ketones, ur: NEGATIVE mg/dL
Leukocytes,Ua: NEGATIVE
Nitrite: NEGATIVE
Protein, ur: NEGATIVE mg/dL
Specific Gravity, Urine: 1.008 (ref 1.005–1.030)
pH: 6 (ref 5.0–8.0)

## 2024-06-23 LAB — WET PREP, GENITAL
Clue Cells Wet Prep HPF POC: NONE SEEN
Sperm: NONE SEEN
Trich, Wet Prep: NONE SEEN
WBC, Wet Prep HPF POC: >=10 — AB (ref <10)

## 2024-06-23 LAB — COMPREHENSIVE METABOLIC PANEL WITH GFR
ALT: 17 U/L (ref 0–44)
AST: 18 U/L (ref 15–41)
Albumin: 4.2 g/dL (ref 3.5–5.0)
Alkaline Phosphatase: 49 U/L (ref 38–126)
Anion gap: 8 (ref 5–15)
BUN: 8 mg/dL (ref 6–20)
CO2: 23 mmol/L (ref 22–32)
Calcium: 9 mg/dL (ref 8.9–10.3)
Chloride: 106 mmol/L (ref 98–111)
Creatinine, Ser: 0.51 mg/dL (ref 0.44–1.00)
GFR, Estimated: >60 mL/min (ref >60)
Glucose, Bld: 83 mg/dL (ref 70–99)
Potassium: 4 mmol/L (ref 3.5–5.1)
Sodium: 137 mmol/L (ref 135–145)
Total Bilirubin: 0.5 mg/dL (ref 0.0–1.2)
Total Protein: 7 g/dL (ref 6.5–8.1)

## 2024-06-23 LAB — ABO/RH: ABO/RH(D): B POS

## 2024-06-23 LAB — POCT PREGNANCY, URINE: Preg Test, Ur: POSITIVE — AB

## 2024-06-23 LAB — HCG, QUANTITATIVE, PREGNANCY: hCG, Beta Chain, Quant, S: 21855 m[IU]/mL — ABNORMAL HIGH (ref <5)

## 2024-06-23 MED ORDER — TERCONAZOLE 0.4 % VA CREA: 1.0000 | TOPICAL_CREAM | Freq: Every day | VAGINAL | 0 refills | Status: DC

## 2024-06-23 NOTE — MAU Provider Note (Signed)
 History     CSN: [id number]  Arrival date and time: 06/23/24 1215   None     Chief Complaint  Patient presents with   Vaginal Discharge   HPI  AngelaAngela Russo is a 33 y.o. female [phone] @ [redacted]w[redacted]d here in MAU with complaints of brown vaginal spotting. No recent intercourse. She reports positive pregnancy test 1 week ago. She had a previous ectopic pregnancy and had her Hcg levels monitored until zero.    OB History     Gravida  6   Para  2   Term  2   Preterm      AB  2   Living  2      SAB  2   IAB  0   Ectopic      Multiple  0   Live Births  2        Obstetric Comments  Pre-e  2020         Past Medical History:  Diagnosis Date   Gallstones    Headache    History of prior pregnancies    patient has 2 daughters   Pregnancy induced hypertension    UTI (urinary tract infection)     Past Surgical History:  Procedure Laterality Date   APPENDECTOMY     CHOLECYSTECTOMY N/A 01/06/2022   Procedure: LAPAROSCOPIC CHOLECYSTECTOMY;  Surgeon: Ebbie Cough, MD;  Location: [facility] OR;  Service: General;  Laterality: N/A;    Family History  Problem Relation Age of Onset   Cancer Mother        leukemia   Diabetes Father    Anemia Sister    Hypertension Brother     Social History   Tobacco Use   Smoking status: Never   Smokeless tobacco: Never  Vaping Use   Vaping status: Never Used  Substance Use Topics   Alcohol use: Never   Drug use: Never    Allergies: No Known Allergies  Medications Prior to Admission  Medication Sig Dispense Refill Last Dose/Taking   acetaminophen  (TYLENOL ) 325 MG tablet Take 2 tablets (650 mg total) by mouth every 6 (six) hours as needed for moderate pain (pain score 4-6). 30 tablet 0    cetirizine  (ZYRTEC ) 10 MG chewable tablet Chew 10 mg by mouth daily.      Results for orders placed or performed during the hospital encounter of 06/23/24 (from the past 48 hours)  Urinalysis, Routine w reflex microscopic -Urine,  Clean Catch     Status: None   Collection Time: 06/23/24 12:54 PM  Result Value Ref Range   Color, Urine YELLOW YELLOW   APPearance CLEAR CLEAR   Specific Gravity, Urine 1.008 1.005 - 1.030   pH 6.0 5.0 - 8.0   Glucose, UA NEGATIVE NEGATIVE mg/dL   Hgb urine dipstick NEGATIVE NEGATIVE   Bilirubin Urine NEGATIVE NEGATIVE   Ketones, ur NEGATIVE NEGATIVE mg/dL   Protein, ur NEGATIVE NEGATIVE mg/dL   Nitrite NEGATIVE NEGATIVE   Leukocytes,Ua NEGATIVE NEGATIVE    Comment: Performed at [facility], [address] [place], [place] [address]  Wet prep, genital     Status: Abnormal   Collection Time: 06/23/24 12:54 PM   Specimen: Urine, Clean Catch  Result Value Ref Range   Yeast Wet Prep HPF POC PRESENT (A) NONE SEEN   Trich, Wet Prep NONE SEEN NONE SEEN   Clue Cells Wet Prep HPF POC NONE SEEN NONE SEEN   WBC, Wet Prep HPF POC >=10 (A) <10  Sperm NONE SEEN     Comment: Performed at [facility], [address], [place], [place] [address]  ABO/Rh     Status: None   Collection Time: 06/23/24  1:06 PM  Result Value Ref Range   ABO/RH(D) B POS    No rh immune globuloin      NOT A RH IMMUNE GLOBULIN CANDIDATE, PT RH POSITIVE Performed at [facility], [address], [place], [place] [address]   CBC with Differential/Platelet     Status: None   Collection Time: 06/23/24  1:13 PM  Result Value Ref Range   WBC 5.6 4.0 - 10.5 K/uL   RBC 4.28 3.87 - 5.11 MIL/uL   Hemoglobin 13.1 12.0 - 15.0 g/dL   HCT 62.1 63.9 - 53.9 %   MCV 88.3 80.0 - 100.0 fL   MCH 30.6 26.0 - 34.0 pg   MCHC 34.7 30.0 - 36.0 g/dL   RDW 87.8 88.4 - 84.4 %   Platelets 278 150 - 400 K/uL   nRBC 0.0 0.0 - 0.2 %   Neutrophils Relative % 53 %   Neutro Abs 3.0 1.7 - 7.7 K/uL   Lymphocytes Relative 35 %   Lymphs Abs 2.0 0.7 - 4.0 K/uL   Monocytes Relative 10 %   Monocytes Absolute 0.6 0.1 - 1.0 K/uL   Eosinophils Relative 1 %   Eosinophils Absolute 0.1 0.0 - 0.5 K/uL   Basophils Relative 1 %    Basophils Absolute 0.0 0.0 - 0.1 K/uL   Immature Granulocytes 0 %   Abs Immature Granulocytes 0.01 0.00 - 0.07 K/uL    Comment: Performed at [facility], [address], [place], [place] [address]  Comprehensive metabolic panel with GFR     Status: None   Collection Time: 06/23/24  1:13 PM  Result Value Ref Range   Sodium 137 135 - 145 mmol/L   Potassium 4.0 3.5 - 5.1 mmol/L   Chloride 106 98 - 111 mmol/L   CO2 23 22 - 32 mmol/L   Glucose, Bld 83 70 - 99 mg/dL    Comment: Glucose reference range applies only to samples taken after fasting for at least 8 hours.   BUN 8 6 - 20 mg/dL   Creatinine, Ser 9.48 0.44 - 1.00 mg/dL   Calcium  9.0 8.9 - 10.3 mg/dL   Total Protein 7.0 6.5 - 8.1 g/dL   Albumin 4.2 3.5 - 5.0 g/dL   AST 18 15 - 41 U/L   ALT 17 0 - 44 U/L   Alkaline Phosphatase 49 38 - 126 U/L   Total Bilirubin 0.5 0.0 - 1.2 mg/dL   GFR, Estimated >39 >39 mL/min    Comment: (NOTE) Calculated using the CKD-EPI Creatinine Equation (2021)    Anion gap 8 5 - 15    Comment: Performed at [facility], [address], [place], [place] [address]  hCG, quantitative, pregnancy     Status: Abnormal   Collection Time: 06/23/24  1:13 PM  Result Value Ref Range   hCG, Beta Chain, Quant, S 21,855 (H) <5 mIU/mL    Comment:          GEST. AGE      CONC.  (mIU/mL)   <=1 WEEK        5 - 50     2 WEEKS       50 - 500     3 WEEKS       100 -  10,000     4 WEEKS     1,000 - 30,000     5 WEEKS     3,500 - 115,000   6-8 WEEKS     12,000 - 270,000    12 WEEKS     15,000 - 220,000        FEMALE AND NON-PREGNANT FEMALE:     LESS THAN 5 mIU/mL Performed at [facility], [address], [place], [place] [address]   Pregnancy, urine POC     Status: Abnormal   Collection Time: 06/23/24  1:26 PM  Result Value Ref Range   Preg Test, Ur POSITIVE (A) NEGATIVE    Comment:        THE SENSITIVITY OF THIS METHODOLOGY IS >20 mIU/mL.      US  OB LESS THAN 14 WEEKS WITH OB  TRANSVAGINAL Result Date: 06/23/2024 CLINICAL DATA:  Vaginal bleeding in 1st trimester pregnancy. EXAM: OBSTETRIC <14 WK US  AND TRANSVAGINAL OB US  TECHNIQUE: Both transabdominal and transvaginal ultrasound examinations were performed for complete evaluation of the gestation as well as the maternal uterus, adnexal regions, and pelvic cul-de-sac. Transvaginal technique was performed to assess early pregnancy. COMPARISON:  None Available. FINDINGS: Intrauterine gestational sac: Single,with irregular sac shape noted Yolk sac:  Visualized. Embryo:  Not Visualized. MSD: 17 mm   7 w   0 d Subchorionic hemorrhage:  Tiny subchorionic hemorrhage noted. Maternal uterus/adnexae: Both ovaries are normal in appearance. No mass or abnormal free fluid identified. IMPRESSION: Single intrauterine gestational sac measuring 7 weeks 0 days. Irregular sac shape and tiny subchorionic hemorrhage are poor prognostic signs. Consider correlation with serial b-hCG levels, and followup ultrasound to assess viability in 10 days. Electronically Signed   By: Norleen DELENA Kil M.D.   On: 06/23/2024 14:36     Review of Systems  Gastrointestinal:  Negative for abdominal pain.  Genitourinary:  Positive for vaginal bleeding and vaginal discharge.   Physical Exam   Blood pressure 102/66, pulse 88, temperature 97.6 F (36.4 C), temperature source Oral, resp. rate 18, height 5' 2 (1.575 m), weight 52.6 kg, last menstrual period 05/19/2024, SpO2 100%, unknown if currently breastfeeding.  Physical Exam Constitutional:      General: She is not in acute distress.    Appearance: Normal appearance. She is not ill-appearing, toxic-appearing or diaphoretic.  Skin:    General: Skin is warm.  Neurological:     Mental Status: She is alert and oriented to person, place, and time.    MAU Course  Procedures  MDM  B positive blood type.  Patient had picture on her phone of very scant brown discharge Wet prep & GC HIV, CBC, Hcg, ABO US  OB  transvaginal   Reviewed US  in details with the patient and her partner.   Assessment and Plan   A:  1. Vaginal bleeding in pregnancy   2. Threatened miscarriage   3. [redacted] weeks gestation of pregnancy   4. Vaginal yeast infection   5. Subchorionic hematoma, antepartum, single or unspecified fetus      P:  Dc home F/u US  in 7 days- order placed  Miscarriage precautions Rx: Terazol. Return to MAU if symptoms worsen  Eda Magnussen, Delon FERNS, NP 06/23/2024 5:54 PM

## 2024-06-23 NOTE — MAU Note (Signed)
 Angela Russo is a 33 y.o. at here in MAU reporting: this is a new pregnancy, had a +HTP 1 week ago. noticed when she went to the bathroom there was brown discharge in her underwear and when she wiped. Pt has pictures on her phone. Denies any abdominal pain or cramping. Denies any unusual vaginal discharge, odor or itching. Denies any recent sexual intercourse.   Patients previous pregnancy was an ectopic pregnancy and received methotrexate  x1 on May 2nd, 2025.    LMP: 05/19/24 Onset of complaint: today Pain score: 0 Vitals:   06/23/24 1239  BP: 102/66  Pulse: 88  Resp: 18  Temp: 97.6 F (36.4 C)  SpO2: 100%     FHT:n/a Lab orders placed from triage:  UA, vaginal swabs

## 2024-06-24 ENCOUNTER — Encounter: Payer: Self-pay | Admitting: Obstetrics and Gynecology

## 2024-06-24 LAB — GC/CHLAMYDIA PROBE AMP (~~LOC~~) NOT AT ARMC
Chlamydia: NEGATIVE
Comment: NEGATIVE
Comment: NORMAL
Neisseria Gonorrhea: NEGATIVE

## 2024-07-24 ENCOUNTER — Inpatient Hospital Stay (HOSPITAL_COMMUNITY)

## 2024-07-24 ENCOUNTER — Inpatient Hospital Stay (HOSPITAL_COMMUNITY)
Admission: AD | Admit: 2024-07-24 | Discharge: 2024-07-24 | Disposition: A | Discharge status: Home / Self Care | Attending: Obstetrics & Gynecology | Admitting: Obstetrics & Gynecology

## 2024-07-24 ENCOUNTER — Encounter (HOSPITAL_COMMUNITY): Payer: Self-pay | Admitting: Obstetrics and Gynecology

## 2024-07-24 ENCOUNTER — Other Ambulatory Visit: Payer: Self-pay

## 2024-07-24 ENCOUNTER — Inpatient Hospital Stay (HOSPITAL_COMMUNITY)
Admission: AD | Admit: 2024-07-24 | Discharge: 2024-07-24 | Disposition: A | Discharge status: Home / Self Care | Source: Home / Self Care | Attending: Obstetrics and Gynecology | Admitting: Obstetrics and Gynecology

## 2024-07-24 DIAGNOSIS — R140 Abdominal distension (gaseous): Secondary | ICD-10-CM | POA: Insufficient documentation

## 2024-07-24 DIAGNOSIS — O26891 Other specified pregnancy related conditions, first trimester: Secondary | ICD-10-CM | POA: Insufficient documentation

## 2024-07-24 DIAGNOSIS — O98811 Other maternal infectious and parasitic diseases complicating pregnancy, first trimester: Secondary | ICD-10-CM

## 2024-07-24 DIAGNOSIS — O23591 Infection of other part of genital tract in pregnancy, first trimester: Secondary | ICD-10-CM | POA: Insufficient documentation

## 2024-07-24 DIAGNOSIS — R109 Unspecified abdominal pain: Secondary | ICD-10-CM | POA: Insufficient documentation

## 2024-07-24 DIAGNOSIS — Z113 Encounter for screening for infections with a predominantly sexual mode of transmission: Secondary | ICD-10-CM | POA: Diagnosis present

## 2024-07-24 DIAGNOSIS — R1032 Left lower quadrant pain: Secondary | ICD-10-CM | POA: Diagnosis present

## 2024-07-24 DIAGNOSIS — Z3A09 9 weeks gestation of pregnancy: Secondary | ICD-10-CM | POA: Diagnosis not present

## 2024-07-24 DIAGNOSIS — B3731 Acute candidiasis of vulva and vagina: Secondary | ICD-10-CM

## 2024-07-24 DIAGNOSIS — R102 Pelvic and perineal pain: Secondary | ICD-10-CM

## 2024-07-24 DIAGNOSIS — O09521 Supervision of elderly multigravida, first trimester: Secondary | ICD-10-CM | POA: Insufficient documentation

## 2024-07-24 LAB — WET PREP, GENITAL
Clue Cells Wet Prep HPF POC: NONE SEEN
Sperm: NONE SEEN
Trich, Wet Prep: NONE SEEN
WBC, Wet Prep HPF POC: <10 (ref <10)

## 2024-07-24 LAB — ABO/RH: ABO/RH(D): B POS

## 2024-07-24 LAB — COMPREHENSIVE METABOLIC PANEL WITH GFR
ALT: 15 U/L (ref 0–44)
AST: 19 U/L (ref 15–41)
Albumin: 3.5 g/dL (ref 3.5–5.0)
Alkaline Phosphatase: 43 U/L (ref 38–126)
Anion gap: 11 (ref 5–15)
BUN: <5 mg/dL — ABNORMAL LOW (ref 6–20)
CO2: 22 mmol/L (ref 22–32)
Calcium: 8.9 mg/dL (ref 8.9–10.3)
Chloride: 103 mmol/L (ref 98–111)
Creatinine, Ser: 0.43 mg/dL — ABNORMAL LOW (ref 0.44–1.00)
GFR, Estimated: >60 mL/min (ref >60)
Glucose, Bld: 93 mg/dL (ref 70–99)
Potassium: 4 mmol/L (ref 3.5–5.1)
Sodium: 136 mmol/L (ref 135–145)
Total Bilirubin: 0.4 mg/dL (ref 0.0–1.2)
Total Protein: 6.3 g/dL — ABNORMAL LOW (ref 6.5–8.1)

## 2024-07-24 LAB — URINALYSIS, ROUTINE W REFLEX MICROSCOPIC
Bilirubin Urine: NEGATIVE
Glucose, UA: NEGATIVE mg/dL
Hgb urine dipstick: NEGATIVE
Ketones, ur: NEGATIVE mg/dL
Leukocytes,Ua: NEGATIVE
Nitrite: NEGATIVE
Protein, ur: NEGATIVE mg/dL
Specific Gravity, Urine: 1.017 (ref 1.005–1.030)
pH: 6 (ref 5.0–8.0)

## 2024-07-24 LAB — CBC WITH DIFFERENTIAL/PLATELET
Abs Immature Granulocytes: 0.02 K/uL (ref 0.00–0.07)
Basophils Absolute: 0 K/uL (ref 0.0–0.1)
Basophils Relative: 1 %
Eosinophils Absolute: 0.1 K/uL (ref 0.0–0.5)
Eosinophils Relative: 2 %
HCT: 36.4 % (ref 36.0–46.0)
Hemoglobin: 12.5 g/dL (ref 12.0–15.0)
Immature Granulocytes: 0 %
Lymphocytes Relative: 34 %
Lymphs Abs: 2 K/uL (ref 0.7–4.0)
MCH: 30.3 pg (ref 26.0–34.0)
MCHC: 34.3 g/dL (ref 30.0–36.0)
MCV: 88.1 fL (ref 80.0–100.0)
Monocytes Absolute: 0.7 K/uL (ref 0.1–1.0)
Monocytes Relative: 11 %
Neutro Abs: 3.1 K/uL (ref 1.7–7.7)
Neutrophils Relative %: 52 %
Platelets: 254 K/uL (ref 150–400)
RBC: 4.13 MIL/uL (ref 3.87–5.11)
RDW: 12.7 % (ref 11.5–15.5)
WBC: 5.9 K/uL (ref 4.0–10.5)
nRBC: 0 % (ref 0.0–0.2)

## 2024-07-24 MED ORDER — FLUCONAZOLE 150 MG PO TABS
150.0000 mg | ORAL_TABLET | Freq: Every day | ORAL | 1 refills | Status: DC
Start: 2024-07-24 — End: 2024-07-24

## 2024-07-24 MED ORDER — SIMETHICONE 80 MG PO CHEW: 80.0000 mg | CHEWABLE_TABLET | Freq: Four times a day (QID) | ORAL | 0 refills | Status: DC | PRN

## 2024-07-24 MED ORDER — SCOPOLAMINE 1 MG/3DAYS TD PT72: 1.0000 | MEDICATED_PATCH | TRANSDERMAL | 12 refills | Status: AC

## 2024-07-24 MED ORDER — FLUCONAZOLE 150 MG PO TABS
150.0000 mg | ORAL_TABLET | Freq: Once | ORAL | Status: AC
  Administered 2024-07-24 (×2): 150 mg via ORAL
  Filled 2024-07-24: qty 1

## 2024-07-24 MED ORDER — SCOPOLAMINE 1 MG/3DAYS TD PT72
1.0000 | MEDICATED_PATCH | TRANSDERMAL | Status: DC
  Administered 2024-07-24 (×2): 1.5 mg via TRANSDERMAL
  Filled 2024-07-24: qty 1

## 2024-07-24 MED ORDER — CYCLOBENZAPRINE HCL 5 MG PO TABS
10.0000 mg | ORAL_TABLET | Freq: Once | ORAL | Status: AC
  Administered 2024-07-24 (×2): 10 mg via ORAL
  Filled 2024-07-24: qty 2

## 2024-07-24 MED ORDER — SIMETHICONE 80 MG PO CHEW
80.0000 mg | CHEWABLE_TABLET | Freq: Four times a day (QID) | ORAL | Status: DC | PRN
  Administered 2024-07-24 (×2): 80 mg via ORAL
  Filled 2024-07-24: qty 1

## 2024-07-24 MED ORDER — ONDANSETRON 4 MG PO TBDP
4.0000 mg | ORAL_TABLET | Freq: Once | ORAL | Status: AC
  Administered 2024-07-24 (×2): 4 mg via ORAL
  Filled 2024-07-24: qty 1

## 2024-07-24 MED ORDER — CYCLOBENZAPRINE HCL 5 MG PO TABS: 5.0000 mg | ORAL_TABLET | Freq: Three times a day (TID) | ORAL | 0 refills | Status: AC | PRN

## 2024-07-24 NOTE — MAU Note (Addendum)
 Angela Russo is a 33 y.o. at [redacted]w[redacted]d here in MAU reporting: since she got home, she has taken the muscle relaxer twice, still having pain.  All over her abd, just hurts, Is very bad. Took nausea medication, sips of water with meds.  Had some rice.  Still throwing up.  No bleeding.   Onset of complaint: ongoing Pain score: 10 Vitals:   07/24/24 1751  BP: 121/83  Pulse: 95  Resp: 16  Temp: 98.7 F (37.1 C)  SpO2: 100%      Lab orders placed from triage:  urine  Unable to void at this time

## 2024-07-24 NOTE — MAU Note (Signed)
 Angela Russo is a 33 y.o. at [redacted]w[redacted]d here in MAU reporting:    Pt states she started feeling lower abdominal cramping and lower left side pain since 4am this morning. Pt took tylenol  0445 650mg x2. Pt states it did not help.  No lof, No bleeding.    Pain score: 10/10 lower abdominal and lower left sided pain.   Vitals:   07/24/24 0641  BP: 111/76  Pulse: 95  Resp: 15  Temp: 98.5 F (36.9 C)  SpO2: 100%     FHT: 161 doppler. Lab orders placed from triage: UA

## 2024-07-24 NOTE — MAU Provider Note (Signed)
 Pelvic cramping    S Ms. Iraida Cragin is a 33 y.o. [phone] pregnant female at [redacted]w[redacted]d who presents to MAU today with complaint of lower abdominal cramping and LLQ pain since 4am 8/13.  Pt states took tylenol  0445 650mg  x2 w/o relief.  No LOF, VB.   SIUP on 7/13 with small subchorionic hemorrhage and irregular sac shape.    Receives care at [facility]. Prenatal records reviewed.  Pertinent items noted in HPI and remainder of comprehensive ROS otherwise negative.   O BP 111/76 (BP Location: Left Arm)   Pulse 95   Temp 98.5 F (36.9 C) (Oral)   Resp 15   Ht 5' 2 (1.575 m)   Wt 53.1 kg   LMP 05/19/2024   SpO2 100%   BMI 21.40 kg/m  Physical Exam Vitals and nursing note reviewed.  Constitutional:      General: She is not in acute distress.    Appearance: She is well-developed and normal weight. She is not ill-appearing.  HENT:     Head: Normocephalic and atraumatic.     Mouth/Throat:     Mouth: Mucous membranes are moist.  Eyes:     Extraocular Movements: Extraocular movements intact.  Cardiovascular:     Rate and Rhythm: Normal rate.  Pulmonary:     Effort: Pulmonary effort is normal. No respiratory distress.  Abdominal:     General: Abdomen is flat. There is no distension.     Tenderness: There is no abdominal tenderness.  Skin:    General: Skin is warm and dry.  Neurological:     Mental Status: She is alert and oriented to person, place, and time.     Motor: No weakness.  Psychiatric:        Mood and Affect: Mood normal.        Behavior: Behavior normal.   Pt informed that the ultrasound is considered a limited OB ultrasound and is not intended to be a complete ultrasound exam.  Patient also informed that the ultrasound is not being completed with the intent of assessing for fetal or placental anomalies or any pelvic abnormalities.  Explained that the purpose of today's ultrasound is to assess for  viability.  Patient acknowledges the purpose of the exam and the limitations of  the study.    My interpretation: active, cardiac activity 154bpm, no obvious worsening of previous [facility]    MDM: MAU Course: Wet prep with yeast infection UA noninfectious  GC collected  CBCdiff nrl ABO/Rh B+ CMP   Pt had improvement in symptoms s/p flexeril .  BSUS reassuring.  Work up reassuring.  Stable for d/c.    AP #[redacted] weeks gestation #Vaginal yeast infection - strict/usual return precautions   Jhonny Augustin BROCKS, MD 07/24/2024 8:09 AM

## 2024-07-24 NOTE — MAU Provider Note (Addendum)
 Chief Complaint: Abdominal Pain and Emesis  SUBJECTIVE HPI: Angela Russo is a 33 y.o. [id number] at [redacted]w[redacted]d by LMP who presents to maternity admissions reporting 10/10 abdominal pain since leaving this morning, flexeril  unhelpful. Associated nausea and emesis. Unable to void.  She denies vaginal bleeding, vaginal itching/burning, urinary symptoms, h/a, dizziness, n/v, or fever/chills.    HPI  Past Medical History:  Diagnosis Date   Gallstones    Headache    History of prior pregnancies    patient has 2 daughters   Pregnancy induced hypertension    UTI (urinary tract infection)    Past Surgical History:  Procedure Laterality Date   APPENDECTOMY  2009   CHOLECYSTECTOMY N/A 01/06/2022   Procedure: LAPAROSCOPIC CHOLECYSTECTOMY;  Surgeon: Ebbie Cough, MD;  Location: [facility] OR;  Service: General;  Laterality: N/A;   Social History   Socioeconomic History   Marital status: Married    Spouse name: Majdi   Number of children: Not on file   Years of education: Not on file   Highest education level: Not on file  Occupational History   Not on file  Tobacco Use   Smoking status: Never   Smokeless tobacco: Never  Vaping Use   Vaping status: Never Used  Substance and Sexual Activity   Alcohol use: Never   Drug use: Never   Sexual activity: Yes    Birth control/protection: None  Other Topics Concern   Not on file  Social History Narrative   Patient is married and has 2 daughters. She drinks coffee and black tea   Patient does not have pets and lives in a house with her family.   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   Patient exercises daily   Social Drivers of Corporate investment banker Strain: Not on file  Food Insecurity: No Food Insecurity (07/24/2024)   Hunger Vital Sign    Worried About Running Out of Food in the Last Year: Never true    Ran Out of Food in the Last Year: Never true  Transportation Needs: No Transportation Needs (07/24/2024)   PRAPARE - Therapist, art (Medical): No    Lack of Transportation (Non-Medical): No  Physical Activity: Not on file  Stress: Not on file  Social Connections: Not on file  Intimate Partner Violence: Not At Risk (07/24/2024)   Humiliation, Afraid, Rape, and Kick questionnaire    Fear of Current or Ex-Partner: No    Emotionally Abused: No    Physically Abused: No    Sexually Abused: No   No current facility-administered medications on file prior to encounter.   Current Outpatient Medications on File Prior to Encounter  Medication Sig Dispense Refill   acetaminophen  (TYLENOL ) 325 MG tablet Take 2 tablets (650 mg total) by mouth every 6 (six) hours as needed for moderate pain (pain score 4-6). 30 tablet 0   cyclobenzaprine  (FLEXERIL ) 5 MG tablet Take 1 tablet (5 mg total) by mouth 3 (three) times daily as needed for up to 7 days for muscle spasms. 21 tablet 0   Prenatal Vit-Fe Fumarate-FA (PRENATAL MULTIVITAMIN) TABS tablet Take 1 tablet by mouth daily at 12 noon.     promethazine  (PHENERGAN ) 25 MG tablet Take 25 mg by mouth every 6 (six) hours as needed for nausea or vomiting.     cetirizine  (ZYRTEC ) 10 MG chewable tablet Chew 10 mg by mouth daily.     fluconazole  (DIFLUCAN ) 150 MG tablet Take 1 tablet (150  mg total) by mouth daily. 1 tablet 1   terconazole  (TERAZOL 7 ) 0.4 % vaginal cream Place 1 applicator vaginally at bedtime. 45 g 0   No Known Allergies  ROS:  Pertinent positives/negatives listed above.  I have reviewed patient's Past Medical Hx, Surgical Hx, Family Hx, Social Hx, medications and allergies.   Physical Exam  Patient Vitals for the past 24 hrs:  BP Temp Temp src Pulse Resp SpO2  07/24/24 1837 122/74 98.6 F (37 C) -- 92 16 100 %  07/24/24 1751 121/83 98.7 F (37.1 C) Oral 95 16 100 %   Constitutional: Well-developed, well-nourished female in no acute distress.  Cardiovascular: normal rate Respiratory: normal effort GI: Abd soft, non-tender. Pos BS x 4 MS:  Extremities nontender, no edema, normal ROM Neurologic: Alert and oriented x 4.  GU: Neg CVAT.  LAB RESULTS Results for orders placed or performed during the hospital encounter of 07/24/24 (from the past 24 hours)  Wet prep, genital     Status: Abnormal   Collection Time: 07/24/24  7:07 AM  Result Value Ref Range   Yeast Wet Prep HPF POC PRESENT (A) NONE SEEN   Trich, Wet Prep NONE SEEN NONE SEEN   Clue Cells Wet Prep HPF POC NONE SEEN NONE SEEN   WBC, Wet Prep HPF POC <10 <10   Sperm NONE SEEN   ABO/Rh     Status: None   Collection Time: 07/24/24  7:19 AM  Result Value Ref Range   ABO/RH(D) B POS    No rh immune globuloin      NOT A RH IMMUNE GLOBULIN CANDIDATE, PT RH POSITIVE Performed at [facility], [address] [place], [place] [address]   CBC with Differential/Platelet     Status: None   Collection Time: 07/24/24  7:20 AM  Result Value Ref Range   WBC 5.9 4.0 - 10.5 K/uL   RBC 4.13 3.87 - 5.11 MIL/uL   Hemoglobin 12.5 12.0 - 15.0 g/dL   HCT 63.5 63.9 - 53.9 %   MCV 88.1 80.0 - 100.0 fL   MCH 30.3 26.0 - 34.0 pg   MCHC 34.3 30.0 - 36.0 g/dL   RDW 87.2 88.4 - 84.4 %   Platelets 254 150 - 400 K/uL   nRBC 0.0 0.0 - 0.2 %   Neutrophils Relative % 52 %   Neutro Abs 3.1 1.7 - 7.7 K/uL   Lymphocytes Relative 34 %   Lymphs Abs 2.0 0.7 - 4.0 K/uL   Monocytes Relative 11 %   Monocytes Absolute 0.7 0.1 - 1.0 K/uL   Eosinophils Relative 2 %   Eosinophils Absolute 0.1 0.0 - 0.5 K/uL   Basophils Relative 1 %   Basophils Absolute 0.0 0.0 - 0.1 K/uL   Immature Granulocytes 0 %   Abs Immature Granulocytes 0.02 0.00 - 0.07 K/uL  Comprehensive metabolic panel     Status: Abnormal   Collection Time: 07/24/24  7:20 AM  Result Value Ref Range   Sodium 136 135 - 145 mmol/L   Potassium 4.0 3.5 - 5.1 mmol/L   Chloride 103 98 - 111 mmol/L   CO2 22 22 - 32 mmol/L   Glucose, Bld 93 70 - 99 mg/dL   BUN <5 (L) 6 - 20 mg/dL   Creatinine, Ser 9.56 (L) 0.44 - 1.00 mg/dL    Calcium  8.9 8.9 - 10.3 mg/dL   Total Protein 6.3 (L) 6.5 - 8.1 g/dL   Albumin 3.5 3.5 - 5.0 g/dL  AST 19 15 - 41 U/L   ALT 15 0 - 44 U/L   Alkaline Phosphatase 43 38 - 126 U/L   Total Bilirubin 0.4 0.0 - 1.2 mg/dL   GFR, Estimated >39 >39 mL/min   Anion gap 11 5 - 15  Urinalysis, Routine w reflex microscopic -Urine, Clean Catch     Status: Abnormal   Collection Time: 07/24/24  7:24 AM  Result Value Ref Range   Color, Urine YELLOW YELLOW   APPearance HAZY (A) CLEAR   Specific Gravity, Urine 1.017 1.005 - 1.030   pH 6.0 5.0 - 8.0   Glucose, UA NEGATIVE NEGATIVE mg/dL   Hgb urine dipstick NEGATIVE NEGATIVE   Bilirubin Urine NEGATIVE NEGATIVE   Ketones, ur NEGATIVE NEGATIVE mg/dL   Protein, ur NEGATIVE NEGATIVE mg/dL   Nitrite NEGATIVE NEGATIVE   Leukocytes,Ua NEGATIVE NEGATIVE    --/--/B POS (08/13 0719)  IMAGING No results found.  MAU Management/MDM: Orders Placed This Encounter  Procedures   US  OB LESS THAN 14 WEEKS WITH OB TRANSVAGINAL   Urinalysis, Routine w reflex microscopic -Urine, Clean Catch    Meds ordered this encounter  Medications   simethicone  (MYLICON) chewable tablet 80 mg   scopolamine  (TRANSDERM-SCOP) 1 MG/3DAYS 1.5 mg   ondansetron  (ZOFRAN -ODT) disintegrating tablet 4 mg   ASSESSMENT 1. [redacted] weeks gestation of pregnancy   2. Bloating   3. Candida vaginitis   9 weeks, SIUP noted this morning by limited bedside US . Will get formal viability US  now.   Bloating. Hx of appendectomy and chol ectomy. Regular bristol 4 stools every other day.  - Improved with simethicone , scopolamine , and Zofran   Known candida vaginitis - unclear if treated earlier today, will give single dose of diflucan  now   PLAN Care relinquished to [facility] @2102   Mardy Shropshire, MD FMOB Fellow, Faculty practice [facility], Center for [facility]  07/24/2024  8:57 PM  No results found.  Reassessment (9:49 PM) -Formal US  pending -Provider reviews imaging  showing SIUP c/w dates. FHR of 167. YS noted.  [facility] noted as well.  -Provider to bedside to discuss.  Patient reports improvement in bloating, but concern with not passing gas. -Informed that this will occur.  Encouraged ambulation and intake as patient reports she has not eaten all day.  -Educated on [facility], what to expect including bleeding, risks for miscarriage, and resolution.  -Instructed to avoid sexual activity while bleeding and for 72 hours after resolution.  -Patient informed this is informal results and final report will be available on mychart once read by radiologist.  -Discussed sending medication for bloating and patient agreeable. -Patient requests and given OOW note for SO. -Encouraged to call primary office or return to MAU if symptoms worsen or with the onset of new symptoms. -Discharged to home in improved condition.  Harlene LITTIE Duncans MSN, CNM Advanced Practice Provider, Center for [employer]

## 2024-07-25 LAB — GC/CHLAMYDIA PROBE AMP (~~LOC~~) NOT AT ARMC
Chlamydia: NEGATIVE
Comment: NEGATIVE
Comment: NORMAL
Neisseria Gonorrhea: NEGATIVE

## 2024-09-30 ENCOUNTER — Inpatient Hospital Stay (HOSPITAL_COMMUNITY)
Admission: AD | Admit: 2024-09-30 | Discharge: 2024-10-01 | Disposition: A | Discharge status: Home / Self Care | Attending: Obstetrics and Gynecology | Admitting: Obstetrics and Gynecology

## 2024-09-30 DIAGNOSIS — O09292 Supervision of pregnancy with other poor reproductive or obstetric history, second trimester: Secondary | ICD-10-CM | POA: Insufficient documentation

## 2024-09-30 DIAGNOSIS — O26892 Other specified pregnancy related conditions, second trimester: Secondary | ICD-10-CM | POA: Diagnosis not present

## 2024-09-30 DIAGNOSIS — R519 Headache, unspecified: Secondary | ICD-10-CM | POA: Insufficient documentation

## 2024-09-30 DIAGNOSIS — O2692 Pregnancy related conditions, unspecified, second trimester: Secondary | ICD-10-CM | POA: Diagnosis present

## 2024-09-30 DIAGNOSIS — Z3A19 19 weeks gestation of pregnancy: Secondary | ICD-10-CM | POA: Insufficient documentation

## 2024-09-30 LAB — URINALYSIS, ROUTINE W REFLEX MICROSCOPIC
Bilirubin Urine: NEGATIVE
Glucose, UA: NEGATIVE mg/dL
Hgb urine dipstick: NEGATIVE
Ketones, ur: NEGATIVE mg/dL
Leukocytes,Ua: NEGATIVE
Nitrite: NEGATIVE
Protein, ur: NEGATIVE mg/dL
Specific Gravity, Urine: 1.005 (ref 1.005–1.030)
pH: 8 (ref 5.0–8.0)

## 2024-09-30 MED ORDER — ONDANSETRON 4 MG PO TBDP
4.0000 mg | ORAL_TABLET | Freq: Once | ORAL | Status: AC
Start: 2024-09-30 — End: 2024-09-30
  Administered 2024-09-30: 4 mg via ORAL
  Filled 2024-09-30: qty 1

## 2024-09-30 MED ORDER — DIPHENHYDRAMINE HCL 25 MG PO CAPS
25.0000 mg | ORAL_CAPSULE | Freq: Once | ORAL | Status: AC
  Administered 2024-09-30: 25 mg via ORAL
  Filled 2024-09-30: qty 1

## 2024-09-30 NOTE — MAU Note (Signed)
..  Angela Russo is a 33 y.o. at [redacted]w[redacted]d here in MAU reporting: headache that began 2 hours ago and has not gone away.  Reports that the headache has given her nausea and emesis x 1  Tylenol  and ibuprofen  1.5 hrs ago  Pain score: 10/10 Vitals:   09/30/24 2123  BP: 108/64  Pulse: (!) 102  Resp: 12  Temp: 98.6 F (37 C)  SpO2: 100%     FHT:140 Lab orders placed from triage:  UA

## 2024-10-01 MED ORDER — METOCLOPRAMIDE HCL 10 MG PO TABS: 10.0000 mg | ORAL_TABLET | Freq: Four times a day (QID) | ORAL | 0 refills | Status: DC | PRN

## 2024-10-01 MED ORDER — ACETAMINOPHEN-CAFFEINE 500-65 MG PO TABS
2.0000 | ORAL_TABLET | Freq: Once | ORAL | Status: AC
  Administered 2024-10-01: 2 via ORAL
  Filled 2024-10-01: qty 2

## 2024-10-01 MED ORDER — ACETAMINOPHEN-CAFFEINE 500-65 MG PO TABS: 2.0000 | ORAL_TABLET | Freq: Four times a day (QID) | ORAL | 0 refills | Status: DC | PRN

## 2024-10-01 NOTE — MAU Note (Cosign Needed)
 Maternal Assessment Unit Provider Note  Subjective: Ms. Angela Russo is a 33 y.o. [phone] pregnant female at [redacted]w[redacted]d who presents to MAU today with complaint of headache.  Patient reports sudden onset of headache 6 PM this evening.  It is characterized by throbbing quality, bilateral temporal location, photophobia, nausea and vomiting.  She took ibuprofen  and Tylenol  at home with no improvement so came to the MAU.  She reports adequate p.o. hydration today.  Notably she has a history of preeclampsia with her first pregnancy.  Her most recent pregnancy she had severe headaches throughout the pregnancy resulting in an elective induction at 39 weeks due to her headaches.  No history of headaches or migraines outside of pregnancy.  Receives care at [facility]. Prenatal records reviewed.  No pregnancy concerns today.  Pertinent items noted in HPI and remainder of comprehensive ROS otherwise negative.   Objective: BP 108/61 (BP Location: Right Arm)   Pulse (!) 102   Temp 98.6 F (37 C) (Oral)   Resp 12   Ht 5' 3 (1.6 m)   Wt 55.8 kg   LMP 05/19/2024   SpO2 100%   BMI 21.79 kg/m  Physical Exam Vitals reviewed.  Constitutional:      General: She is not in acute distress.    Appearance: She is well-developed. She is not diaphoretic.  Eyes:     General: No scleral icterus. Neck:     Comments: No meningeal signs Cardiovascular:     Rate and Rhythm: Normal rate and regular rhythm.  Pulmonary:     Effort: Pulmonary effort is normal. No respiratory distress.  Abdominal:     General: There is no distension.     Palpations: Abdomen is soft.     Tenderness: There is no abdominal tenderness. There is no guarding or rebound.  Musculoskeletal:     Cervical back: Normal range of motion and neck supple.  Skin:    General: Skin is warm and dry.  Neurological:     Mental Status: She is alert.     GCS: GCS eye subscore is 4. GCS verbal subscore is 5. GCS motor subscore is 6.     Cranial Nerves: No  cranial nerve deficit, dysarthria or facial asymmetry.     Sensory: No sensory deficit.     Motor: No weakness.  Psychiatric:        Mood and Affect: Mood normal.        Speech: Speech normal.        Behavior: Behavior normal.     MDM: Moderate risk  MAU Course:  Time: 0230  In the MAU vital signs have been normal.  She was treated with 25mg  diphenhydramine , 4 mg Zofran , with mild improvement of her headache.  Since 6 hours since her last dose of Tylenol  she was given Excedrin Migraine which significantly improved her headache.   Quality sounds migrainous.  She has a history of headache in pregnancy with her prior pregnancy.  No concern for preeclampsia at this time due to early gestational age and normal blood pressure.  No meningeal signs, or for headache red flag symptoms.  Discussed discharge patient given strict return precautions  Questions were answered to the satisfaction of the patient and/or family prior to discharge.   Assessment Medical screening exam complete    ICD-10-CM   1. Pregnancy headache in second trimester  O26.892    R51.9      Plan -Significantly improved status posttreatment with diphenhydramine , Zofran , and Excedrin migraine -Prescribed Reglan   and Excedrin Migraine -Counseled to avoid ibuprofen  in pregnancy Discharge from MAU in stable condition with strict return precautions Follow [facility] as scheduled for ongoing prenatal care  Allergies as of 10/01/2024   No Known Allergies      Medication List     TAKE these medications    acetaminophen  325 MG tablet Commonly known as: Tylenol  Take 2 tablets (650 mg total) by mouth every 6 (six) hours as needed for moderate pain (pain score 4-6).   acetaminophen -caffeine 500-65 MG Tabs per tablet Commonly known as: EXCEDRIN TENSION HEADACHE Take 2 tablets by mouth every 6 (six) hours as needed (headache).   cetirizine  10 MG chewable tablet Commonly known as: ZYRTEC  Chew 10 mg by mouth  daily.   metoCLOPramide  10 MG tablet Commonly known as: Reglan  Take 1 tablet (10 mg total) by mouth every 6 (six) hours as needed for nausea or vomiting.   prenatal multivitamin Tabs tablet Take 1 tablet by mouth daily at 12 noon.   promethazine  25 MG tablet Commonly known as: PHENERGAN  Take 25 mg by mouth every 6 (six) hours as needed for nausea or vomiting.   scopolamine  1 MG/3DAYS Commonly known as: TRANSDERM-SCOP Place 1 patch (1.5 mg total) onto the skin every 3 (three) days.   simethicone  80 MG chewable tablet Commonly known as: MYLICON Chew 1 tablet (80 mg total) by mouth 4 (four) times daily as needed for flatulence.        Trudy Leeroy NOVAK, MD 10/01/2024 2:43 AM

## 2025-01-10 ENCOUNTER — Ambulatory Visit (INDEPENDENT_AMBULATORY_CARE_PROVIDER_SITE_OTHER): Payer: Medicaid Other | Admitting: Family

## 2025-01-10 ENCOUNTER — Encounter: Payer: Self-pay | Admitting: Family

## 2025-01-10 VITALS — BP 116/72 | HR 98 | Temp 97.6°F | Resp 19 | Ht 63.0 in | Wt 137.0 lb

## 2025-01-10 DIAGNOSIS — Z0000 Encounter for general adult medical examination without abnormal findings: Secondary | ICD-10-CM

## 2025-01-10 DIAGNOSIS — Z3A33 33 weeks gestation of pregnancy: Secondary | ICD-10-CM

## 2025-01-11 LAB — CBC WITH DIFFERENTIAL/PLATELET
Absolute Lymphocytes: 1875 {cells}/uL (ref 850–3900)
Absolute Monocytes: 1118 {cells}/uL — ABNORMAL HIGH (ref 200–950)
Basophils Absolute: 38 {cells}/uL (ref 0–200)
Basophils Relative: 0.5 %
Eosinophils Absolute: 158 {cells}/uL (ref 15–500)
Eosinophils Relative: 2.1 %
HCT: 38.8 % (ref 35.9–46.0)
Hemoglobin: 13.1 g/dL (ref 11.7–15.5)
MCH: 31.9 pg (ref 27.0–33.0)
MCHC: 33.8 g/dL (ref 31.6–35.4)
MCV: 94.4 fL (ref 81.4–101.7)
MPV: 10.6 fL (ref 7.5–12.5)
Monocytes Relative: 14.9 %
Neutro Abs: 4313 {cells}/uL (ref 1500–7800)
Neutrophils Relative %: 57.5 %
Platelets: 256 10*3/uL (ref 140–400)
RBC: 4.11 Million/uL (ref 3.80–5.10)
RDW: 12.6 % (ref 11.0–15.0)
Total Lymphocyte: 25 %
WBC: 7.5 10*3/uL (ref 3.8–10.8)

## 2025-01-11 LAB — COMPREHENSIVE METABOLIC PANEL WITH GFR
AG Ratio: 1.4 (calc) (ref 1.0–2.5)
ALT: 11 U/L (ref 6–29)
AST: 16 U/L (ref 10–30)
Albumin: 3.5 g/dL — ABNORMAL LOW (ref 3.6–5.1)
Alkaline phosphatase (APISO): 374 U/L — ABNORMAL HIGH (ref 31–125)
BUN/Creatinine Ratio: 9 (calc) (ref 6–22)
BUN: 3 mg/dL — ABNORMAL LOW (ref 7–25)
CO2: 25 mmol/L (ref 20–32)
Calcium: 9 mg/dL (ref 8.6–10.2)
Chloride: 104 mmol/L (ref 98–110)
Creat: 0.35 mg/dL — ABNORMAL LOW (ref 0.50–0.97)
Globulin: 2.5 g/dL (ref 1.9–3.7)
Glucose, Bld: 73 mg/dL (ref 65–139)
Potassium: 3.8 mmol/L (ref 3.5–5.3)
Sodium: 137 mmol/L (ref 135–146)
Total Bilirubin: 0.4 mg/dL (ref 0.2–1.2)
Total Protein: 6 g/dL — ABNORMAL LOW (ref 6.1–8.1)
eGFR: 137 mL/min/{1.73_m2}

## 2025-01-11 LAB — TSH: TSH: 1.38 m[IU]/L

## 2026-01-12 ENCOUNTER — Encounter: Admitting: Family
